# Patient Record
Sex: Female | Born: 1970
Health system: Southern US, Community
[De-identification: ages and names within clinical notes are randomized; demographics above are authoritative.]

## PROBLEM LIST (undated history)

## (undated) DIAGNOSIS — G43009 Migraine without aura, not intractable, without status migrainosus: Secondary | ICD-10-CM

## (undated) HISTORY — DX: Migraine without aura, not intractable, without status migrainosus: G43.009

---
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
## 2015-04-01 DIAGNOSIS — J342 Deviated nasal septum: Secondary | ICD-10-CM | POA: Insufficient documentation

## 2015-07-02 ENCOUNTER — Encounter: Payer: Self-pay | Admitting: Obstetrics and Gynecology

## 2015-07-02 ENCOUNTER — Ambulatory Visit (INDEPENDENT_AMBULATORY_CARE_PROVIDER_SITE_OTHER): Payer: Managed Care, Other (non HMO) | Admitting: Obstetrics and Gynecology

## 2015-07-02 VITALS — BP 118/70 | HR 76 | Resp 14 | Ht 60.5 in | Wt 119.0 lb

## 2015-07-02 DIAGNOSIS — Z01419 Encounter for gynecological examination (general) (routine) without abnormal findings: Secondary | ICD-10-CM

## 2015-07-02 DIAGNOSIS — Z0000 Encounter for general adult medical examination without abnormal findings: Secondary | ICD-10-CM

## 2015-07-02 DIAGNOSIS — Z124 Encounter for screening for malignant neoplasm of cervix: Secondary | ICD-10-CM

## 2015-07-02 LAB — COMPREHENSIVE METABOLIC PANEL
ALBUMIN: 4.2 g/dL (ref 3.6–5.1)
ALK PHOS: 50 U/L (ref 33–115)
ALT: 12 U/L (ref 6–29)
AST: 16 U/L (ref 10–30)
BILIRUBIN TOTAL: 0.6 mg/dL (ref 0.2–1.2)
BUN: 11 mg/dL (ref 7–25)
CALCIUM: 9.3 mg/dL (ref 8.6–10.2)
CO2: 23 mmol/L (ref 20–31)
CREATININE: 0.86 mg/dL (ref 0.50–1.10)
Chloride: 102 mmol/L (ref 98–110)
Glucose, Bld: 73 mg/dL (ref 65–99)
Potassium: 4.2 mmol/L (ref 3.5–5.3)
SODIUM: 138 mmol/L (ref 135–146)
TOTAL PROTEIN: 7 g/dL (ref 6.1–8.1)

## 2015-07-02 LAB — CBC
HEMATOCRIT: 42.7 % (ref 35.0–45.0)
HEMOGLOBIN: 14.1 g/dL (ref 11.7–15.5)
MCH: 28.3 pg (ref 27.0–33.0)
MCHC: 33 g/dL (ref 32.0–36.0)
MCV: 85.6 fL (ref 80.0–100.0)
MPV: 9.4 fL (ref 7.5–12.5)
Platelets: 307 10*3/uL (ref 140–400)
RBC: 4.99 MIL/uL (ref 3.80–5.10)
RDW: 13.3 % (ref 11.0–15.0)
WBC: 5.4 10*3/uL (ref 3.8–10.8)

## 2015-07-02 LAB — LIPID PANEL
CHOLESTEROL: 199 mg/dL (ref 125–200)
HDL: 88 mg/dL (ref >=46)
LDL CALC: 86 mg/dL (ref <130)
TRIGLYCERIDES: 124 mg/dL (ref <150)
Total CHOL/HDL Ratio: 2.3 Ratio (ref <=5.0)
VLDL: 25 mg/dL (ref <30)

## 2015-07-02 MED ORDER — ETONOGESTREL-ETHINYL ESTRADIOL 0.12-0.015 MG/24HR VA RING: VAGINAL_RING | VAGINAL | Status: DC

## 2015-07-02 NOTE — Progress Notes (Signed)
Patient ID: Laura Goodwin, female   DOB: 06/23/1970, 45 y.o.   MRN: [id number] 45 y.o. [id number] MarriedCaucasianF here for annual exam. She is needing refill of her Nuvaring. Sexually active, no pain. Period Duration (Days): 2-4 days  Period Pattern: Regular Menstrual Flow: Light Menstrual Control: Thin pad Dysmenorrhea: (!) Mild Dysmenorrhea Symptoms: Cramping  Patient's last menstrual period was 06/06/2015.          Sexually active: Yes.    The current method of family planning is NuvaRing vaginal inserts.    Exercising: Yes.    running/ walking/ weights Smoker:  no  Health Maintenance: Pap:  06/2014 WNL per patient History of abnormal Pap:  Yes- years ago, she had a leep. MMG:  07/2013 ([place] in [place])  Colonoscopy:  Never BMD:   07/2013 WNL per patient (done in [place])  TDaP:  2011 Gardasil: N/A   reports that she has never smoked. She has never used smokeless tobacco. She reports that she drinks about 1.2 oz of alcohol per week. 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 Kids are 8 and 5.   History reviewed. No pertinent past medical history.  Past Surgical History  Procedure Laterality Date  . Cesarean section    . Brain surgery  1980    removed abcess on brain   C/S x 2. After her 1st c/s she needed a second laparotomy 4 days later secondary to bleeding  Current Outpatient Prescriptions  Medication Sig Dispense Refill  . etonogestrel-ethinyl estradiol (NUVARING) 0.12-0.015 MG/24HR vaginal ring     . fluticasone (FLONASE) 50 MCG/ACT nasal spray Place 1 spray into both nostrils daily.     No current facility-administered medications for this visit.    Family History  Problem Relation Age of Onset  . Hypertension Father   . Stroke Maternal Grandmother   . Stroke Paternal Grandmother     Review of Systems  Constitutional: Negative.   HENT: Negative.   Eyes: Negative.   Respiratory: Negative.   Cardiovascular: Negative.    Gastrointestinal: Negative.   Endocrine: Negative.   Genitourinary: Negative.   Musculoskeletal: Negative.   Skin: Negative.   Allergic/Immunologic: Negative.   Neurological: Negative.   Psychiatric/Behavioral: Negative.     Exam:   BP 118/70 mmHg  Pulse 76  Resp 14  Ht 5' 0.5" (1.537 m)  Wt 119 lb (53.978 kg)  BMI 22.85 kg/m2  LMP 06/06/2015  Weight change: @WEIGHTCHANGE @ Height:   Height: 5' 0.5" (153.7 cm)  Ht Readings from Last 3 Encounters:  07/02/15 5' 0.5" (1.537 m)    General appearance: alert, cooperative and appears stated age Head: Normocephalic, without obvious abnormality, atraumatic Neck: no adenopathy, supple, symmetrical, trachea midline and thyroid normal to inspection and palpation Lungs: clear to auscultation bilaterally Breasts: normal appearance, no masses or tenderness Heart: regular rate and rhythm Abdomen: soft, non-tender; bowel sounds normal; no masses,  no organomegaly Extremities: extremities normal, atraumatic, no cyanosis or edema Skin: Skin color, texture, turgor normal. No rashes or lesions Lymph nodes: Cervical, supraclavicular, and axillary nodes normal. No abnormal inguinal nodes palpated Neurologic: Grossly normal   Pelvic: External genitalia:  no lesions              Urethra:  normal appearing urethra with no masses, tenderness or lesions              Bartholins and Skenes: normal  Vagina: normal appearing vagina with normal color and discharge, no lesions              Cervix: no lesions               Bimanual Exam:  Uterus:  normal size, contour, position, consistency, mobility, non-tender              Adnexa: no mass, fullness, tenderness               Rectovaginal: Confirms               Anus:  normal sphincter tone, no lesions  Chaperone was present for exam.  A:  Well Woman with normal exam  Contraception, continue nuvaring  P:   CBC, CMP, lipids, vit D  Pap with hpv  Continue nuvaring  Discussed breast  self exam  Discussed calcium and vit D intake

## 2015-07-02 NOTE — Patient Instructions (Signed)

## 2015-07-03 LAB — VITAMIN D 25 HYDROXY (VIT D DEFICIENCY, FRACTURES): VIT D 25 HYDROXY: 30 ng/mL (ref 30–100)

## 2015-07-08 LAB — IPS PAP TEST WITH HPV

## 2015-11-17 ENCOUNTER — Other Ambulatory Visit: Payer: Self-pay | Admitting: Obstetrics and Gynecology

## 2015-11-17 DIAGNOSIS — Z1231 Encounter for screening mammogram for malignant neoplasm of breast: Secondary | ICD-10-CM

## 2015-11-26 ENCOUNTER — Ambulatory Visit
Admission: RE | Admit: 2015-11-26 | Discharge: 2015-11-26 | Disposition: A | Discharge status: Home / Self Care | Payer: Managed Care, Other (non HMO) | Source: Ambulatory Visit | Attending: Obstetrics and Gynecology | Admitting: Obstetrics and Gynecology

## 2015-11-26 DIAGNOSIS — Z1231 Encounter for screening mammogram for malignant neoplasm of breast: Secondary | ICD-10-CM

## 2015-11-27 ENCOUNTER — Other Ambulatory Visit: Payer: Self-pay | Admitting: Obstetrics and Gynecology

## 2015-11-27 DIAGNOSIS — R928 Other abnormal and inconclusive findings on diagnostic imaging of breast: Secondary | ICD-10-CM

## 2015-12-01 ENCOUNTER — Ambulatory Visit
Admission: RE | Admit: 2015-12-01 | Discharge: 2015-12-01 | Disposition: A | Discharge status: Home / Self Care | Payer: Managed Care, Other (non HMO) | Source: Ambulatory Visit | Attending: Obstetrics and Gynecology | Admitting: Obstetrics and Gynecology

## 2015-12-01 DIAGNOSIS — R928 Other abnormal and inconclusive findings on diagnostic imaging of breast: Secondary | ICD-10-CM

## 2016-07-06 ENCOUNTER — Ambulatory Visit (INDEPENDENT_AMBULATORY_CARE_PROVIDER_SITE_OTHER): Payer: BLUE CROSS/BLUE SHIELD | Admitting: Obstetrics and Gynecology

## 2016-07-06 ENCOUNTER — Encounter: Payer: Self-pay | Admitting: Obstetrics and Gynecology

## 2016-07-06 VITALS — BP 118/70 | HR 76 | Resp 14 | Ht 60.0 in | Wt 121.0 lb

## 2016-07-06 DIAGNOSIS — Z3009 Encounter for other general counseling and advice on contraception: Secondary | ICD-10-CM | POA: Diagnosis not present

## 2016-07-06 DIAGNOSIS — Z0000 Encounter for general adult medical examination without abnormal findings: Secondary | ICD-10-CM

## 2016-07-06 DIAGNOSIS — R899 Unspecified abnormal finding in specimens from other organs, systems and tissues: Secondary | ICD-10-CM

## 2016-07-06 DIAGNOSIS — G43009 Migraine without aura, not intractable, without status migrainosus: Secondary | ICD-10-CM | POA: Insufficient documentation

## 2016-07-06 DIAGNOSIS — E559 Vitamin D deficiency, unspecified: Secondary | ICD-10-CM

## 2016-07-06 DIAGNOSIS — Z01419 Encounter for gynecological examination (general) (routine) without abnormal findings: Secondary | ICD-10-CM | POA: Diagnosis not present

## 2016-07-06 LAB — COMPREHENSIVE METABOLIC PANEL
ALK PHOS: 67 U/L (ref 33–115)
ALT: 37 U/L — AB (ref 6–29)
AST: 35 U/L — ABNORMAL HIGH (ref 10–30)
Albumin: 4.6 g/dL (ref 3.6–5.1)
BUN: 10 mg/dL (ref 7–25)
CALCIUM: 9.9 mg/dL (ref 8.6–10.2)
CHLORIDE: 102 mmol/L (ref 98–110)
CO2: 24 mmol/L (ref 20–31)
Creat: 0.9 mg/dL (ref 0.50–1.10)
GLUCOSE: 88 mg/dL (ref 65–99)
POTASSIUM: 4.3 mmol/L (ref 3.5–5.3)
Sodium: 138 mmol/L (ref 135–146)
TOTAL PROTEIN: 7.2 g/dL (ref 6.1–8.1)
Total Bilirubin: 0.9 mg/dL (ref 0.2–1.2)

## 2016-07-06 LAB — CBC
HEMATOCRIT: 43.5 % (ref 35.0–45.0)
Hemoglobin: 14.4 g/dL (ref 11.7–15.5)
MCH: 28.7 pg (ref 27.0–33.0)
MCHC: 33.1 g/dL (ref 32.0–36.0)
MCV: 86.8 fL (ref 80.0–100.0)
MPV: 9.2 fL (ref 7.5–12.5)
Platelets: 260 10*3/uL (ref 140–400)
RBC: 5.01 MIL/uL (ref 3.80–5.10)
RDW: 13.6 % (ref 11.0–15.0)
WBC: 5.9 10*3/uL (ref 3.8–10.8)

## 2016-07-06 MED ORDER — MISOPROSTOL 200 MCG PO TABS: ORAL_TABLET | ORAL | 0 refills | Status: DC

## 2016-07-06 NOTE — Progress Notes (Signed)
46 y.o. [id number] MarriedCaucasianF here for annual exam. Patient stopped using the Nuvaring last month due to in headaches. When she would take the nuvaring out she would get a headache a few days later. Very bad with mild sensitivity in her vision. Within a day or 2 of putting the nuvaring back in the headache would go away. She tried using the nuvaring continuously and had irregular bleeding. She stopped the nuvaringPatient wants to discuss new birth control options. After her second child. They tried to put a mirena in and was told there was too much scar tissue to get it in (c/s x 2 and laparotomy after one c/s she had a laparotomy for bleeding)  Period Cycle (Days): 28 Period Duration (Days): 2-3 days  Period Pattern: Regular Menstrual Flow: Light Menstrual Control: Thin pad Menstrual Control Change Freq (Hours): changes pad 2-3 times a day Dysmenorrhea: (!) Moderate Dysmenorrhea Symptoms: Headache, Cramping  Her cycle off of the nuvaring lasted x 5 days. Changed a pad 3 x a day. Cramps were no different. Headaches are better off of the nuvaring  Patient's last menstrual period was 06/15/2016.          Sexually active: Yes.    The current method of family planning is none.    Exercising: Yes.    running, weights, cardio  Smoker:  no  Health Maintenance: Pap:  07-02-15 WNL NEG HR HPV  History of abnormal Pap:  Yes- years ago MMG:  11-26-15  Abnormal U/S left breast WNL  Colonoscopy:  Never BMD:   Never TDaP:  07/2009 Gardasil: N/A   reports that she has never smoked. She has never used smokeless tobacco. She reports that she drinks about 1.2 oz of alcohol per week . She reports that she does not use drugs. 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   Past Medical History:  Diagnosis Date  . Migraine without aura     Past Surgical History:  Procedure Laterality Date  . BRAIN SURGERY  1980   removed abcess on brain   . CESAREAN SECTION      Current Outpatient Prescriptions   Medication Sig Dispense Refill  . fluticasone (FLONASE) 50 MCG/ACT nasal spray Place 1 spray into both nostrils daily.    Marland Kitchen etonogestrel-ethinyl estradiol (NUVARING) 0.12-0.015 MG/24HR vaginal ring Place vaginally, remove after 3 weeks. Place a new ring within a week (Patient not taking: Reported on 07/06/2016) 3 each 3   No current facility-administered medications for this visit.     Family History  Problem Relation Age of Onset  . Hypertension Father   . Stroke Maternal Grandmother   . Stroke Paternal Grandmother     Review of Systems  Constitutional: Negative.   HENT: Negative.   Eyes: Negative.   Respiratory: Negative.   Cardiovascular: Negative.   Gastrointestinal: Negative.   Endocrine: Negative.   Genitourinary: Negative.   Musculoskeletal: Negative.   Skin: Negative.   Allergic/Immunologic: Negative.   Neurological: Positive for headaches.  Psychiatric/Behavioral: Negative.     Exam:   BP 118/70 (BP Location: Right Arm, Patient Position: Sitting, Cuff Size: Normal)   Pulse 76   Resp 14   Ht 5' (1.524 m)   Wt 121 lb (54.9 kg)   LMP 06/15/2016   BMI 23.63 kg/m   Weight change: @WEIGHTCHANGE @ Height:   Height: 5' (152.4 cm)  Ht Readings from Last 3 Encounters:  07/06/16 5' (1.524 m)  07/02/15 5' 0.5" (1.537 m)    General appearance: alert, cooperative and  appears stated age Head: Normocephalic, without obvious abnormality, atraumatic Neck: no adenopathy, supple, symmetrical, trachea midline and thyroid normal to inspection and palpation Lungs: clear to auscultation bilaterally Cardiovascular: regular rate and rhythm Breasts: normal appearance, no masses or tenderness Abdomen: soft, non-tender; bowel sounds normal; no masses,  no organomegaly Extremities: extremities normal, atraumatic, no cyanosis or edema Skin: Skin color, texture, turgor normal. No rashes or lesions Lymph nodes: Cervical, supraclavicular, and axillary nodes normal. No abnormal inguinal  nodes palpated Neurologic: Grossly normal   Pelvic: External genitalia:  no lesions              Urethra:  normal appearing urethra with no masses, tenderness or lesions              Bartholins and Skenes: normal                 Vagina: normal appearing vagina with normal color and discharge, no lesions              Cervix: no lesions and appears stenotic, able to place a cytobrush               Bimanual Exam:  Uterus:  normal size, contour, position, consistency, mobility, non-tender              Adnexa: no mass, fullness, tenderness               Rectovaginal: Confirms               Anus:  normal sphincter tone, no lesions  Chaperone was present for exam.  A:  Well Woman with normal exam  Contraception, worsening migraines with the nuvaring  Screening labs  Vit d def  P:   No pap this year  Mammogram in the fall  CBC, CMP, vit D (normal lipids last year)  Discussed trying loseasoinique, she declines  Will try placing a mirena IUD under ultrasound guidance (couldn't be placed by prior provider)  Will pre-treat with cytotec

## 2016-07-06 NOTE — Patient Instructions (Signed)

## 2016-07-07 LAB — VITAMIN D 25 HYDROXY (VIT D DEFICIENCY, FRACTURES): VIT D 25 HYDROXY: 28 ng/mL — AB (ref 30–100)

## 2016-07-08 ENCOUNTER — Telehealth: Payer: Self-pay

## 2016-07-08 NOTE — Telephone Encounter (Signed)
Left message for patient to call Rowen Wilmer back.  

## 2016-07-08 NOTE — Telephone Encounter (Signed)
-----   Message from Romualdo BolkJill Evelyn Jertson, MD sent at 07/08/2016 10:31 AM EDT ----- Please let the patient know that her vit d is slightly low. She should start taking 800 IU a day of vit d 3 daily (long term).  Her liver function tests are minimally elevated. She was having lots of headaches, it is possible for ibuprofen or tylenol to cause this. She doesn't drink very much, but I would recommend that she not drink ETOH and try to avoid NSAID's and tylenol for the next month and then recheck a liver panel.  The rest of her lab work was normal.

## 2016-07-11 NOTE — Telephone Encounter (Signed)
Patient advised of lab results. Patient made appointment for 08/15/16 to have labs redrawn per Dr. Salli QuarryJertson's request. Patient verbalized understanding and agreement.

## 2016-07-11 NOTE — Telephone Encounter (Signed)
Patient returniing your call

## 2016-07-12 ENCOUNTER — Telehealth: Payer: Self-pay | Admitting: Obstetrics and Gynecology

## 2016-07-12 NOTE — Telephone Encounter (Signed)
Patient cancelled US guided IUD insertion for this Thursday.  Will call back to reschedule when she starts her period.

## 2016-07-14 ENCOUNTER — Other Ambulatory Visit: Payer: BLUE CROSS/BLUE SHIELD

## 2016-07-14 ENCOUNTER — Other Ambulatory Visit: Payer: BLUE CROSS/BLUE SHIELD | Admitting: Obstetrics and Gynecology

## 2016-08-03 NOTE — Telephone Encounter (Signed)
Placed call to patient to  follow up in regards to rescheduling an ultrasound guided Mirena IUD insertion. Patient advises she should start her cycle any day, but is leaving tomorrow, 08/04/16 for vacation and will be out of town through 08/15/16. Patients states she would like to defer scheduling, until her August cycle. Patient states she will call at the onset of her cycle in August, for scheduling.  Routing to Dr Oscar LaJertson for final review.

## 2016-08-15 ENCOUNTER — Other Ambulatory Visit (INDEPENDENT_AMBULATORY_CARE_PROVIDER_SITE_OTHER): Payer: BLUE CROSS/BLUE SHIELD

## 2016-08-15 DIAGNOSIS — R899 Unspecified abnormal finding in specimens from other organs, systems and tissues: Secondary | ICD-10-CM

## 2016-08-15 NOTE — Addendum Note (Signed)
Addended by: Francee PiccoloPHILLIPS, Terriyah Westra C on: 08/15/2016 11:44 AM   Modules accepted: Orders

## 2016-08-16 LAB — HEPATIC FUNCTION PANEL
ALT: 30 IU/L (ref 0–32)
AST: 38 IU/L (ref 0–40)
Albumin: 4.6 g/dL (ref 3.5–5.5)
Alkaline Phosphatase: 71 IU/L (ref 39–117)
Bilirubin Total: 0.8 mg/dL (ref 0.0–1.2)
Bilirubin, Direct: 0.19 mg/dL (ref 0.00–0.40)
Total Protein: 6.8 g/dL (ref 6.0–8.5)

## 2016-11-02 ENCOUNTER — Other Ambulatory Visit: Payer: Self-pay | Admitting: Obstetrics and Gynecology

## 2016-11-02 DIAGNOSIS — Z1231 Encounter for screening mammogram for malignant neoplasm of breast: Secondary | ICD-10-CM

## 2016-11-29 ENCOUNTER — Encounter (INDEPENDENT_AMBULATORY_CARE_PROVIDER_SITE_OTHER): Payer: Self-pay

## 2016-11-29 ENCOUNTER — Ambulatory Visit
Admission: RE | Admit: 2016-11-29 | Discharge: 2016-11-29 | Disposition: A | Discharge status: Home / Self Care | Payer: Managed Care, Other (non HMO) | Source: Ambulatory Visit | Attending: Obstetrics and Gynecology | Admitting: Obstetrics and Gynecology

## 2016-11-29 DIAGNOSIS — Z1231 Encounter for screening mammogram for malignant neoplasm of breast: Secondary | ICD-10-CM

## 2017-03-10 DIAGNOSIS — J069 Acute upper respiratory infection, unspecified: Secondary | ICD-10-CM | POA: Diagnosis not present

## 2017-03-10 DIAGNOSIS — J019 Acute sinusitis, unspecified: Secondary | ICD-10-CM | POA: Diagnosis not present

## 2017-04-24 DIAGNOSIS — M25562 Pain in left knee: Secondary | ICD-10-CM | POA: Diagnosis not present

## 2017-07-10 NOTE — Progress Notes (Signed)
47 y.o. [id number] MarriedCaucasianF here for annual exam.   Period Cycle (Days): 30 Period Duration (Days): 3-6 days Period Pattern: Regular Menstrual Flow: Light(heavy first 2 days) Menstrual Control: Tampon Menstrual Control Change Freq (Hours): every 3 hours on heaviest days Dysmenorrhea: (!) Moderate Dysmenorrhea Symptoms: Cramping(nausea 2 days prior)  She c/o irritability for 5-6 days prior to her cycle coming. Gets angry and frustrated. She was having bad headaches on the nuvaring, headaches are better off of the nuvaring. She uses exercise as a stress release.   Patient's last menstrual period was 06/17/2017 (exact date).          Sexually active: Yes.    The current method of family planning is condoms everytime.    Exercising: Yes.    cardio and weights. Smoker:  no  Health Maintenance: Pap: 07-02-15 Neg:Neg HR HPV History of abnormal Pap:  Yes, years ago MMG:  11-29-16 Density C/Neg/BiRads1 Colonoscopy:  n/a BMD:   n/a TDaP:  07/2009 Gardasil: no, discussed, would like to start.    reports that she has never smoked. She has never used smokeless tobacco. She reports that she drinks about 1.2 oz of alcohol per week. 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  Past Medical History:  Diagnosis Date  . Migraine without aura     Past Surgical History:  Procedure Laterality Date  . BRAIN SURGERY  1980   removed abcess on brain   . CESAREAN SECTION      Current Outpatient Medications  Medication Sig Dispense Refill  . fluticasone (FLONASE) 50 MCG/ACT nasal spray Place 1 spray into both nostrils daily.     No current facility-administered medications for this visit.     Family History  Problem Relation Age of Onset  . Hypertension Father   . Stroke Maternal Grandmother   . Stroke Paternal Grandmother   . Breast cancer Maternal Aunt     Review of Systems  Constitutional: Negative.   HENT: Negative.   Eyes: Negative.    Respiratory: Negative.   Cardiovascular: Negative.   Gastrointestinal: Negative.   Endocrine: Negative.   Genitourinary: Negative.   Musculoskeletal: Negative.   Skin: Negative.   Allergic/Immunologic: Negative.   Neurological: Negative.   Hematological: Negative.   Psychiatric/Behavioral: Negative.     Exam:   BP 110/66 (BP Location: Right Arm, Patient Position: Sitting, Cuff Size: Normal)   Pulse 70   Resp 16   Ht 5' 1.25" (1.556 m)   Wt 121 lb (54.9 kg)   LMP 06/17/2017 (Exact Date)   BMI 22.68 kg/m   Weight change: @WEIGHTCHANGE @ Height:   Height: 5' 1.25" (155.6 cm)  Ht Readings from Last 3 Encounters:  07/12/17 5' 1.25" (1.556 m)  07/06/16 5' (1.524 m)  07/02/15 5' 0.5" (1.537 m)    General appearance: alert, cooperative and appears stated age Head: Normocephalic, without obvious abnormality, atraumatic Neck: no adenopathy, supple, symmetrical, trachea midline and thyroid normal to inspection and palpation Lungs: clear to auscultation bilaterally Cardiovascular: regular rate and rhythm Breasts: bilateral fibrocystic changes. In the left breast at 11 o'clock is a 3 x 4 cm, cystic, mobile lump, not tender. No other distinct lumps. No skin changes. Abdomen: soft, non-tender; non distended,  no masses,  no organomegaly Extremities: extremities normal, atraumatic, no cyanosis or edema Skin: Skin color, texture, turgor normal. No rashes or lesions Lymph nodes: Cervical, supraclavicular, and axillary nodes normal. No abnormal inguinal nodes palpated Neurologic: Grossly normal  Pelvic: External genitalia:  no lesions              Urethra:  normal appearing urethra with no masses, tenderness or lesions              Bartholins and Skenes: normal                 Vagina: normal appearing vagina with normal color and discharge, no lesions              Cervix: no lesions               Bimanual Exam:  Uterus:  normal size, contour, position, consistency, mobility,  non-tender              Adnexa: no mass, fullness, tenderness               Rectovaginal: Confirms               Anus:  normal sphincter tone, no lesions  Chaperone was present for exam.  A:  Well Woman with normal exam  PMS  Vit d def  LFT elevated slightly last year  Left breast lump, 11 o'clock  P:   No Pap  Left breast imaging now, bilateral mammogram in the fall  Start Celexa 10 mg, will f/u next month  Discussed breast self exam  Discussed calcium and vit D intake  Screening labs, vit D  IFOB given  Start gardasil series

## 2017-07-12 ENCOUNTER — Encounter: Payer: Self-pay | Admitting: Obstetrics and Gynecology

## 2017-07-12 ENCOUNTER — Other Ambulatory Visit: Payer: Self-pay

## 2017-07-12 ENCOUNTER — Ambulatory Visit (INDEPENDENT_AMBULATORY_CARE_PROVIDER_SITE_OTHER): Payer: BLUE CROSS/BLUE SHIELD | Admitting: Obstetrics and Gynecology

## 2017-07-12 VITALS — BP 110/66 | HR 70 | Resp 16 | Ht 61.25 in | Wt 121.0 lb

## 2017-07-12 DIAGNOSIS — N943 Premenstrual tension syndrome: Secondary | ICD-10-CM | POA: Diagnosis not present

## 2017-07-12 DIAGNOSIS — Z23 Encounter for immunization: Secondary | ICD-10-CM

## 2017-07-12 DIAGNOSIS — E559 Vitamin D deficiency, unspecified: Secondary | ICD-10-CM | POA: Diagnosis not present

## 2017-07-12 DIAGNOSIS — Z7189 Other specified counseling: Secondary | ICD-10-CM | POA: Diagnosis not present

## 2017-07-12 DIAGNOSIS — Z0000 Encounter for general adult medical examination without abnormal findings: Secondary | ICD-10-CM | POA: Diagnosis not present

## 2017-07-12 DIAGNOSIS — Z01419 Encounter for gynecological examination (general) (routine) without abnormal findings: Secondary | ICD-10-CM | POA: Diagnosis not present

## 2017-07-12 DIAGNOSIS — Z7185 Encounter for immunization safety counseling: Secondary | ICD-10-CM

## 2017-07-12 DIAGNOSIS — Z1211 Encounter for screening for malignant neoplasm of colon: Secondary | ICD-10-CM

## 2017-07-12 DIAGNOSIS — N6322 Unspecified lump in the left breast, upper inner quadrant: Secondary | ICD-10-CM

## 2017-07-12 MED ORDER — CITALOPRAM HYDROBROMIDE 10 MG PO TABS: 10.0000 mg | ORAL_TABLET | Freq: Every day | ORAL | 1 refills | Status: DC

## 2017-07-12 NOTE — Patient Instructions (Addendum)
EXERCISE AND DIET:  We recommended that you start or continue a regular exercise program for good health. Regular exercise means any activity that makes your heart beat faster and makes you sweat.  We recommend exercising at least 30 minutes per day at least 3 days a week, preferably 4 or 5.  We also recommend a diet low in fat and sugar.  Inactivity, poor dietary choices and obesity can cause diabetes, heart attack, stroke, and kidney damage, among others.    ALCOHOL AND SMOKING:  Women should limit their alcohol intake to no more than 7 drinks/beers/glasses of wine (combined, not each!) per week. Moderation of alcohol intake to this level decreases your risk of breast cancer and liver damage. And of course, no recreational drugs are part of a healthy lifestyle.  And absolutely no smoking or even second hand smoke. Most people know smoking can cause heart and lung diseases, but did you know it also contributes to weakening of your bones? Aging of your skin?  Yellowing of your teeth and nails?  CALCIUM AND VITAMIN D:  Adequate intake of calcium and Vitamin D are recommended.  The recommendations for exact amounts of these supplements seem to change often, but generally speaking 600 mg of calcium (either carbonate or citrate) and 800 units of Vitamin D per day seems prudent. Certain women may benefit from higher intake of Vitamin D.  If you are among these women, your doctor will have told you during your visit.    PAP SMEARS:  Pap smears, to check for cervical cancer or precancers,  have traditionally been done yearly, although recent scientific advances have shown that most women can have pap smears less often.  However, every woman still should have a physical exam from her gynecologist every year. It will include a breast check, inspection of the vulva and vagina to check for abnormal growths or skin changes, a visual exam of the cervix, and then an exam to evaluate the size and shape of the uterus and  ovaries.  And after 45 years of age, a rectal exam is indicated to check for rectal cancers. We will also provide age appropriate advice regarding health maintenance, like when you should have certain vaccines, screening for sexually transmitted diseases, bone density testing, colonoscopy, mammograms, etc.   MAMMOGRAMS:  All women over 40 years old should have a yearly mammogram. Many facilities now offer a "3D" mammogram, which may cost around $50 extra out of pocket. If possible,  we recommend you accept the option to have the 3D mammogram performed.  It both reduces the number of women who will be called back for extra views which then turn out to be normal, and it is better than the routine mammogram at detecting truly abnormal areas.    COLONOSCOPY:  Colonoscopy to screen for colon cancer is recommended for all women at age 50.  We know, you hate the idea of the prep.  We agree, BUT, having colon cancer and not knowing it is worse!!  Colon cancer so often starts as a polyp that can be seen and removed at colonscopy, which can quite literally save your life!  And if your first colonoscopy is normal and you have no family history of colon cancer, most women don't have to have it again for 10 years.  Once every ten years, you can do something that may end up saving your life, right?  We will be happy to help you get it scheduled when you are ready.    Be sure to check your insurance coverage so you understand how much it will cost.  It may be covered as a preventative service at no cost, but you should check your particular policy.      Breast Self-Awareness Breast self-awareness means being familiar with how your breasts look and feel. It involves checking your breasts regularly and reporting any changes to your health care provider. Practicing breast self-awareness is important. A change in your breasts can be a sign of a serious medical problem. Being familiar with how your breasts look and feel allows  you to find any problems early, when treatment is more likely to be successful. All women should practice breast self-awareness, including women who have had breast implants. How to do a breast self-exam One way to learn what is normal for your breasts and whether your breasts are changing is to do a breast self-exam. To do a breast self-exam: Look for Changes  1. Remove all the clothing above your waist. 2. Stand in front of a mirror in a room with good lighting. 3. Put your hands on your hips. 4. Push your hands firmly downward. 5. Compare your breasts in the mirror. Look for differences between them (asymmetry), such as: ? Differences in shape. ? Differences in size. ? Puckers, dips, and bumps in one breast and not the other. 6. Look at each breast for changes in your skin, such as: ? Redness. ? Scaly areas. 7. Look for changes in your nipples, such as: ? Discharge. ? Bleeding. ? Dimpling. ? Redness. ? A change in position. Feel for Changes  Carefully feel your breasts for lumps and changes. It is best to do this while lying on your back on the floor and again while sitting or standing in the shower or tub with soapy water on your skin. Feel each breast in the following way:  Place the arm on the side of the breast you are examining above your head.  Feel your breast with the other hand.  Start in the nipple area and make  inch (2 cm) overlapping circles to feel your breast. Use the pads of your three middle fingers to do this. Apply light pressure, then medium pressure, then firm pressure. The light pressure will allow you to feel the tissue closest to the skin. The medium pressure will allow you to feel the tissue that is a little deeper. The firm pressure will allow you to feel the tissue close to the ribs.  Continue the overlapping circles, moving downward over the breast until you feel your ribs below your breast.  Move one finger-width toward the center of the body.  Continue to use the  inch (2 cm) overlapping circles to feel your breast as you move slowly up toward your collarbone.  Continue the up and down exam using all three pressures until you reach your armpit.  Write Down What You Find  Write down what is normal for each breast and any changes that you find. Keep a written record with breast changes or normal findings for each breast. By writing this information down, you do not need to depend only on memory for size, tenderness, or location. Write down where you are in your menstrual cycle, if you are still menstruating. If you are having trouble noticing differences in your breasts, do not get discouraged. With time you will become more familiar with the variations in your breasts and more comfortable with the exam. How often should I examine my breasts? Examine   your breasts every month. If you are breastfeeding, the best time to examine your breasts is after a feeding or after using a breast pump. If you menstruate, the best time to examine your breasts is 5-7 days after your period is over. During your period, your breasts are lumpier, and it may be more difficult to notice changes. When should I see my health care provider? See your health care provider if you notice:  A change in shape or size of your breasts or nipples.  A change in the skin of your breast or nipples, such as a reddened or scaly area.  Unusual discharge from your nipples.  A lump or thick area that was not there before.  Pain in your breasts.  Anything that concerns you.  This information is not intended to replace advice given to you by your health care provider. Make sure you discuss any questions you have with your health care provider. Document Released: 01/24/2005 Document Revised: 07/02/2015 Document Reviewed: 12/14/2014 Elsevier Interactive Patient Education  2018 [employer]  

## 2017-07-13 ENCOUNTER — Ambulatory Visit
Admission: RE | Admit: 2017-07-13 | Discharge: 2017-07-13 | Disposition: A | Discharge status: Home / Self Care | Payer: Managed Care, Other (non HMO) | Source: Ambulatory Visit | Attending: Obstetrics and Gynecology | Admitting: Obstetrics and Gynecology

## 2017-07-13 DIAGNOSIS — N6489 Other specified disorders of breast: Secondary | ICD-10-CM | POA: Diagnosis not present

## 2017-07-13 DIAGNOSIS — N6322 Unspecified lump in the left breast, upper inner quadrant: Secondary | ICD-10-CM

## 2017-07-13 DIAGNOSIS — R922 Inconclusive mammogram: Secondary | ICD-10-CM | POA: Diagnosis not present

## 2017-07-13 LAB — COMPREHENSIVE METABOLIC PANEL
ALK PHOS: 66 IU/L (ref 39–117)
ALT: 15 IU/L (ref 0–32)
AST: 16 IU/L (ref 0–40)
Albumin/Globulin Ratio: 1.6 (ref 1.2–2.2)
Albumin: 4.5 g/dL (ref 3.5–5.5)
BILIRUBIN TOTAL: 0.4 mg/dL (ref 0.0–1.2)
BUN / CREAT RATIO: 17 (ref 9–23)
BUN: 13 mg/dL (ref 6–24)
CHLORIDE: 102 mmol/L (ref 96–106)
CO2: 23 mmol/L (ref 20–29)
Calcium: 9.7 mg/dL (ref 8.7–10.2)
Creatinine, Ser: 0.76 mg/dL (ref 0.57–1.00)
GFR calc non Af Amer: 95 mL/min/{1.73_m2} (ref >59)
GFR, EST AFRICAN AMERICAN: 110 mL/min/{1.73_m2} (ref >59)
GLUCOSE: 87 mg/dL (ref 65–99)
Globulin, Total: 2.8 g/dL (ref 1.5–4.5)
Potassium: 4.5 mmol/L (ref 3.5–5.2)
Sodium: 139 mmol/L (ref 134–144)
TOTAL PROTEIN: 7.3 g/dL (ref 6.0–8.5)

## 2017-07-13 LAB — CBC
Hematocrit: 43.3 % (ref 34.0–46.6)
Hemoglobin: 14.2 g/dL (ref 11.1–15.9)
MCH: 29 pg (ref 26.6–33.0)
MCHC: 32.8 g/dL (ref 31.5–35.7)
MCV: 88 fL (ref 79–97)
PLATELETS: 296 10*3/uL (ref 150–450)
RBC: 4.9 x10E6/uL (ref 3.77–5.28)
RDW: 13.2 % (ref 12.3–15.4)
WBC: 6.6 10*3/uL (ref 3.4–10.8)

## 2017-07-13 LAB — LIPID PANEL
CHOLESTEROL TOTAL: 175 mg/dL (ref 100–199)
Chol/HDL Ratio: 2.2 ratio (ref 0.0–4.4)
HDL: 80 mg/dL (ref >39)
LDL Calculated: 83 mg/dL (ref 0–99)
Triglycerides: 60 mg/dL (ref 0–149)
VLDL Cholesterol Cal: 12 mg/dL (ref 5–40)

## 2017-07-13 LAB — VITAMIN D 25 HYDROXY (VIT D DEFICIENCY, FRACTURES): Vit D, 25-Hydroxy: 23.6 ng/mL — ABNORMAL LOW (ref 30.0–100.0)

## 2017-07-14 ENCOUNTER — Telehealth: Payer: Self-pay

## 2017-07-14 NOTE — Telephone Encounter (Signed)
Called patient to discuss results, left message for her to return my call. 

## 2017-07-14 NOTE — Telephone Encounter (Signed)
-----   Message from Romualdo BolkJill Evelyn Jertson, MD sent at 07/14/2017  1:45 PM EDT ----- Please inform the patient that her vit d is low and has dropped since last year. I would recommend that she increase her current vit D intake by 1,000 IU a day (long term). I would recheck her vit d next year. The rest of her blood work was normal.

## 2017-07-14 NOTE — Telephone Encounter (Signed)
Returned patient's call and lmovm to call me back. Advised this was nothing urgent or serious and could call Consuella Loselaine back on Monday.

## 2017-07-14 NOTE — Telephone Encounter (Signed)
Patient returning Amanda's call.

## 2017-07-18 NOTE — Telephone Encounter (Signed)
Patient returned call to Amanda. °

## 2017-07-20 NOTE — Telephone Encounter (Signed)
Spoke with patient and gave results and recommendations. Patient voiced understanding -eh 

## 2017-07-21 ENCOUNTER — Telehealth: Payer: Self-pay

## 2017-07-21 NOTE — Telephone Encounter (Signed)
Removed from mammogram hold. Patient is returning in July for a recheck and would like to have breast recheck at that time as well. Encounter closed.

## 2017-07-21 NOTE — Telephone Encounter (Signed)
-----   Message from Romualdo BolkJill Evelyn Jertson, MD sent at 07/14/2017  3:25 PM EDT ----- Please set the patient up for a 3 month breast check and take out of mammogram hold

## 2017-08-17 ENCOUNTER — Ambulatory Visit: Payer: BLUE CROSS/BLUE SHIELD | Admitting: Obstetrics and Gynecology

## 2017-08-29 ENCOUNTER — Encounter: Payer: Self-pay | Admitting: Obstetrics and Gynecology

## 2017-08-29 ENCOUNTER — Ambulatory Visit (INDEPENDENT_AMBULATORY_CARE_PROVIDER_SITE_OTHER): Payer: BLUE CROSS/BLUE SHIELD | Admitting: Obstetrics and Gynecology

## 2017-08-29 ENCOUNTER — Other Ambulatory Visit: Payer: Self-pay

## 2017-08-29 VITALS — BP 118/78 | HR 80 | Wt 121.4 lb

## 2017-08-29 DIAGNOSIS — E559 Vitamin D deficiency, unspecified: Secondary | ICD-10-CM

## 2017-08-29 DIAGNOSIS — E538 Deficiency of other specified B group vitamins: Secondary | ICD-10-CM

## 2017-08-29 DIAGNOSIS — N943 Premenstrual tension syndrome: Secondary | ICD-10-CM | POA: Diagnosis not present

## 2017-08-29 DIAGNOSIS — N6012 Diffuse cystic mastopathy of left breast: Secondary | ICD-10-CM

## 2017-08-29 NOTE — Progress Notes (Signed)
GYNECOLOGY  VISIT   HPI: 47 y.o.   Married  Caucasian  female   G2P2002 with Patient's last menstrual period was 08/15/2017 (exact date).   here for 1 month follow up on Celexa. She was started on Celexa for PMS. Patient stopped taking Celexa, because it made her sleepy in the afternoon. She only took it for a few days, on 10 mg dose.  Patient did not start Vitamin D as directed, misunderstood and started vit B12.  Needs breast recheck of L breast mass 11 o'clock. Negative imaging, area in question is a ridge of tissue. She feels it hasn't changes.   GYNECOLOGIC HISTORY: Patient's last menstrual period was 08/15/2017 (exact date). Contraception: Condoms Menopausal hormone therapy: None        OB History    Gravida  2   Para  2   Term  2   Preterm      AB      Living  2     SAB      TAB      Ectopic      Multiple      Live Births  2              Patient Active Problem List   Diagnosis Date Noted  . Migraine without aura     Past Medical History:  Diagnosis Date  . Migraine without aura     Past Surgical History:  Procedure Laterality Date  . BRAIN SURGERY  1980   removed abcess on brain   . CESAREAN SECTION      Current Outpatient Medications  Medication Sig Dispense Refill  . Cyanocobalamin (VITAMIN B 12 PO) Take 1 tablet by mouth.    . fluticasone (FLONASE) 50 MCG/ACT nasal spray Place 1 spray into both nostrils daily.     No current facility-administered medications for this visit.      ALLERGIES: Penicillins  Family History  Problem Relation Age of Onset  . Hypertension Father   . Stroke Maternal Grandmother   . Stroke Paternal Grandmother   . Breast cancer Maternal Aunt     Social History   Socioeconomic History  . Marital status: Married    Spouse name: Not on file  . Number of children: Not on file  . Years of education: Not on file  . Highest education level: Not on file  Occupational History  . Not on file  Social Needs   . Financial resource strain: Not on file  . Food insecurity:    Worry: Not on file    Inability: Not on file  . Transportation needs:    Medical: Not on file    Non-medical: Not on file  Tobacco Use  . Smoking status: Never Smoker  . Smokeless tobacco: Never Used  Substance and Sexual Activity  . Alcohol use: Yes    Alcohol/week: 1.2 oz    Types: 2 Standard drinks or equivalent per week  . Drug use: No  . Sexual activity: Yes    Partners: Male    Birth control/protection: Condom  Lifestyle  . Physical activity:    Days per week: Not on file    Minutes per session: Not on file  . Stress: Not on file  Relationships  . Social connections:    Talks on phone: Not on file    Gets together: Not on file    Attends religious service: Not on file    Active member of club or organization: Not on  file    Attends meetings of clubs or organizations: Not on file    Relationship status: Not on file  . Intimate partner violence:    Fear of current or ex partner: Not on file    Emotionally abused: Not on file    Physically abused: Not on file    Forced sexual activity: Not on file  Other Topics Concern  . Not on file  Social History Narrative  . Not on file    Review of Systems  Constitutional: Negative.   HENT: Negative.   Eyes: Negative.   Respiratory: Negative.   Cardiovascular: Negative.   Gastrointestinal: Negative.   Genitourinary: Negative.   Musculoskeletal: Negative.   Skin: Negative.   Neurological: Negative.   Endo/Heme/Allergies: Negative.   Psychiatric/Behavioral: Negative.     PHYSICAL EXAMINATION:    BP 118/78 (BP Location: Right Arm, Patient Position: Sitting)   Pulse 80   Wt 121 lb 6.4 oz (55.1 kg)   LMP 08/15/2017 (Exact Date)   BMI 22.75 kg/m     General appearance: alert, cooperative and appears stated age Breasts: in the left breast at 11-12 o'clock is a 3 x 2 cm, smooth mobile lump (smaller). Bilateral fibrocystic changes laterally in the upper  outer quadrants.  No supraclavicular or axillary adenopathy  ASSESSMENT Stable left breast lump Vit D def, patient misunderstood and started vit B12 PMS, only took the Celexa for a few days, made her tired    PLAN Continue breast self exams Check vit B12 level Recommend she start vit D supplements We discussed that she can change the time of day she takes the Celexa and this will likely help with the sleepiness. Side effects usually improve with time.  She will restart the Celexa and f/u in the next 1-2 months  An After Visit Summary was printed and given to the patient.

## 2017-08-30 LAB — VITAMIN B12: Vitamin B-12: 314 pg/mL (ref 232–1245)

## 2017-09-18 NOTE — Progress Notes (Signed)
Patient in today for 2nd Gardasil injection.   Contraception: condoms LMP: 09/15/17 Last AEX: 07/12/2017 with Dr Oscar LaJertson.  Injection given in right deltoid  Patient tolerated shot well.   Patient informed next injection due in about 4 months.  Advised patient, if not on birth control, to return for next injection with cycle.   Routed to provider for final review.  Encounter closed.

## 2017-09-19 ENCOUNTER — Ambulatory Visit (INDEPENDENT_AMBULATORY_CARE_PROVIDER_SITE_OTHER): Payer: BLUE CROSS/BLUE SHIELD

## 2017-09-19 VITALS — BP 118/62 | HR 60 | Ht 61.0 in | Wt 119.0 lb

## 2017-09-19 DIAGNOSIS — Z23 Encounter for immunization: Secondary | ICD-10-CM | POA: Diagnosis not present

## 2017-10-31 ENCOUNTER — Telehealth: Payer: Self-pay | Admitting: Obstetrics and Gynecology

## 2017-10-31 NOTE — Telephone Encounter (Signed)
Patient cancelled 2 month recheck. She is doing fine.

## 2017-11-02 ENCOUNTER — Ambulatory Visit: Payer: BLUE CROSS/BLUE SHIELD | Admitting: Obstetrics and Gynecology

## 2017-11-24 DIAGNOSIS — Z23 Encounter for immunization: Secondary | ICD-10-CM | POA: Diagnosis not present

## 2018-07-26 ENCOUNTER — Ambulatory Visit: Payer: BLUE CROSS/BLUE SHIELD | Admitting: Obstetrics and Gynecology

## 2018-08-28 ENCOUNTER — Other Ambulatory Visit: Payer: Self-pay

## 2018-08-28 NOTE — Progress Notes (Signed)
48 y.o. G32P2002 Married White or Caucasian Unavailable female here for annual exam. No dyspareunia.    Period Cycle (Days): 28 Period Duration (Days): 4 days Period Pattern: Regular Menstrual Flow: Moderate, Light Menstrual Control: Tampon Menstrual Control Change Freq (Hours): changes tampon every 3-4 hours Dysmenorrhea: (!) Moderate Dysmenorrhea Symptoms: Cramping  Patient's last menstrual period was 08/05/2018 (exact date).          Sexually active: Yes.    The current method of family planning is condoms most of the time.    Exercising: Yes.    running, walking, lifting weights Smoker:  no  Health Maintenance: Pap: 07-02-15 Neg:Neg HR HPV History of abnormal Pap:  Yes, years ago MMG:  07/13/2017 diag left with u/s Birads 1 negative, was due for screening 11/2017 Colonoscopy:  n/a BMD:   n/a TDaP:  07/2009, she thinks it is due.  Gardasil: Completed 2   reports that she has never smoked. She has never used smokeless tobacco. She reports current alcohol use of about 2.0 standard drinks of alcohol per week. She reports that she does not use drugs. 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 Kids are 11 and 8.  Past Medical History:  Diagnosis Date  . Migraine without aura     Past Surgical History:  Procedure Laterality Date  . [place]   removed abcess on brain   . CESAREAN SECTION      Current Outpatient Medications  Medication Sig Dispense Refill  . Cyanocobalamin (VITAMIN B 12 PO) Take 1 tablet by mouth.    . fluticasone (FLONASE) 50 MCG/ACT nasal spray Place 1 spray into both nostrils daily.     No current facility-administered medications for this visit.     Family History  Problem Relation Age of Onset  . Hypertension Father   . Stroke Maternal Grandmother   . Stroke Paternal Grandmother   . Breast cancer Maternal Aunt     Review of Systems  Constitutional: Negative.   HENT: Negative.   Eyes: Negative.   Respiratory:  Negative.   Cardiovascular: Negative.   Gastrointestinal: Negative.   Endocrine: Negative.   Genitourinary: Negative.   Musculoskeletal: Negative.   Skin: Negative.   Allergic/Immunologic: Negative.   Neurological: Negative.   Hematological: Negative.   Psychiatric/Behavioral: Negative.     Exam:   BP 122/82 (BP Location: Right Arm, Patient Position: Sitting, Cuff Size: Normal)   Pulse 76   Temp 98.2 F (36.8 C) (Skin)   Ht 5' 0.63" (1.54 m)   Wt 120 lb (54.4 kg)   LMP 08/05/2018 (Exact Date)   BMI 22.95 kg/m   Weight change: @WEIGHTCHANGE @ Height:   Height: 5' 0.63" (154 cm)  Ht Readings from Last 3 Encounters:  08/30/18 5' 0.63" (1.54 m)  09/19/17 5\' 1"  (1.549 m)  07/12/17 5' 1.25" (1.556 m)    General appearance: alert, cooperative and appears stated age Head: Normocephalic, without obvious abnormality, atraumatic Neck: no adenopathy, supple, symmetrical, trachea midline and thyroid normal to inspection and palpation Lungs: clear to auscultation bilaterally Cardiovascular: regular rate and rhythm Breasts: bilateral fibrocystic chantes, In the left breast is a stable 3-4 cm, mobile, NT lump. No skin changes, no other lumps.  Abdomen: soft, non-tender; non distended,  no masses,  no organomegaly Extremities: extremities normal, atraumatic, no cyanosis or edema Skin: Skin color, texture, turgor normal. No rashes or lesions Lymph nodes: Cervical, supraclavicular, and axillary nodes normal. No abnormal inguinal nodes palpated Neurologic: Grossly  normal   Pelvic: External genitalia:  no lesions              Urethra:  normal appearing urethra with no masses, tenderness or lesions              Bartholins and Skenes: normal                 Vagina: normal appearing vagina with normal color and discharge, no lesions              Cervix: no lesions               Bimanual Exam:  Uterus:  normal size, contour, position, consistency, mobility, non-tender and anteverted               Adnexa: no mass, fullness, tenderness               Rectovaginal: Confirms               Anus:  normal sphincter tone, no lesions  Chaperone was present for exam.  A:  Well Woman with normal exam  Stable breast lump, previous normal imagina  P:   Pap with hpv  Screening labs, vit d, B12  TDAP today  Mammogram overdue, recommend 3D  Discussed breast self exam  Discussed calcium and vit D intake  Cologuard

## 2018-08-30 ENCOUNTER — Encounter: Payer: Self-pay | Admitting: Obstetrics and Gynecology

## 2018-08-30 ENCOUNTER — Other Ambulatory Visit: Payer: Self-pay

## 2018-08-30 ENCOUNTER — Ambulatory Visit (INDEPENDENT_AMBULATORY_CARE_PROVIDER_SITE_OTHER): Payer: BC Managed Care – PPO | Admitting: Obstetrics and Gynecology

## 2018-08-30 ENCOUNTER — Other Ambulatory Visit (HOSPITAL_COMMUNITY)
Admission: RE | Admit: 2018-08-30 | Discharge: 2018-08-30 | Disposition: A | Discharge status: Home / Self Care | Payer: BC Managed Care – PPO | Source: Ambulatory Visit | Attending: Obstetrics and Gynecology | Admitting: Obstetrics and Gynecology

## 2018-08-30 VITALS — BP 122/82 | HR 76 | Temp 98.2°F | Ht 60.63 in | Wt 120.0 lb

## 2018-08-30 DIAGNOSIS — Z124 Encounter for screening for malignant neoplasm of cervix: Secondary | ICD-10-CM

## 2018-08-30 DIAGNOSIS — Z23 Encounter for immunization: Secondary | ICD-10-CM

## 2018-08-30 DIAGNOSIS — Z0000 Encounter for general adult medical examination without abnormal findings: Secondary | ICD-10-CM | POA: Diagnosis not present

## 2018-08-30 DIAGNOSIS — E538 Deficiency of other specified B group vitamins: Secondary | ICD-10-CM

## 2018-08-30 DIAGNOSIS — E559 Vitamin D deficiency, unspecified: Secondary | ICD-10-CM | POA: Diagnosis not present

## 2018-08-30 DIAGNOSIS — Z01419 Encounter for gynecological examination (general) (routine) without abnormal findings: Secondary | ICD-10-CM | POA: Diagnosis not present

## 2018-08-30 DIAGNOSIS — N6322 Unspecified lump in the left breast, upper inner quadrant: Secondary | ICD-10-CM

## 2018-08-30 NOTE — Patient Instructions (Signed)
Check on coverage for Cologuard  Set up a 3D mammogram  EXERCISE AND DIET:  We recommended that you start or continue a regular exercise program for good health. Regular exercise means any activity that makes your heart beat faster and makes you sweat.  We recommend exercising at least 30 minutes per day at least 3 days a week, preferably 4 or 5.  We also recommend a diet low in fat and sugar.  Inactivity, poor dietary choices and obesity can cause diabetes, heart attack, stroke, and kidney damage, among others.    ALCOHOL AND SMOKING:  Women should limit their alcohol intake to no more than 7 drinks/beers/glasses of wine (combined, not each!) per week. Moderation of alcohol intake to this level decreases your risk of breast cancer and liver damage. And of course, no recreational drugs are part of a healthy lifestyle.  And absolutely no smoking or even second hand smoke. Most people know smoking can cause heart and lung diseases, but did you know it also contributes to weakening of your bones? Aging of your skin?  Yellowing of your teeth and nails?  CALCIUM AND VITAMIN D:  Adequate intake of calcium and Vitamin D are recommended.  The recommendations for exact amounts of these supplements seem to change often, but generally speaking 1,000 mg of calcium (between diet and supplement) and 800 units of Vitamin D per day seems prudent. Certain women may benefit from higher intake of Vitamin D.  If you are among these women, your doctor will have told you during your visit.    PAP SMEARS:  Pap smears, to check for cervical cancer or precancers,  have traditionally been done yearly, although recent scientific advances have shown that most women can have pap smears less often.  However, every woman still should have a physical exam from her gynecologist every year. It will include a breast check, inspection of the vulva and vagina to check for abnormal growths or skin changes, a visual exam of the cervix, and then  an exam to evaluate the size and shape of the uterus and ovaries.  And after 46 years of age, a rectal exam is indicated to check for rectal cancers. We will also provide age appropriate advice regarding health maintenance, like when you should have certain vaccines, screening for sexually transmitted diseases, bone density testing, colonoscopy, mammograms, etc.   MAMMOGRAMS:  All women over 49 years old should have a yearly mammogram. Many facilities now offer a "3D" mammogram, which may cost around $50 extra out of pocket. If possible,  we recommend you accept the option to have the 3D mammogram performed.  It both reduces the number of women who will be called back for extra views which then turn out to be normal, and it is better than the routine mammogram at detecting truly abnormal areas.    COLON CANCER SCREENING: Now recommend starting at age 48. At this time colonoscopy is not covered for routine screening until 50. There are take home tests that can be done between 45-49.   COLONOSCOPY:  Colonoscopy to screen for colon cancer is recommended for all women at age 31.  We know, you hate the idea of the prep.  We agree, BUT, having colon cancer and not knowing it is worse!!  Colon cancer so often starts as a polyp that can be seen and removed at colonscopy, which can quite literally save your life!  And if your first colonoscopy is normal and you have no family history  of colon cancer, most women don't have to have it again for 10 years.  Once every ten years, you can do something that may end up saving your life, right?  We will be happy to help you get it scheduled when you are ready.  Be sure to check your insurance coverage so you understand how much it will cost.  It may be covered as a preventative service at no cost, but you should check your particular policy.      Breast Self-Awareness Breast self-awareness means being familiar with how your breasts look and feel. It involves checking your  breasts regularly and reporting any changes to your health care provider. Practicing breast self-awareness is important. A change in your breasts can be a sign of a serious medical problem. Being familiar with how your breasts look and feel allows you to find any problems early, when treatment is more likely to be successful. All women should practice breast self-awareness, including women who have had breast implants. How to do a breast self-exam One way to learn what is normal for your breasts and whether your breasts are changing is to do a breast self-exam. To do a breast self-exam: Look for Changes  1. Remove all the clothing above your waist. 2. Stand in front of a mirror in a room with good lighting. 3. Put your hands on your hips. 4. Push your hands firmly downward. 5. Compare your breasts in the mirror. Look for differences between them (asymmetry), such as: ? Differences in shape. ? Differences in size. ? Puckers, dips, and bumps in one breast and not the other. 6. Look at each breast for changes in your skin, such as: ? Redness. ? Scaly areas. 7. Look for changes in your nipples, such as: ? Discharge. ? Bleeding. ? Dimpling. ? Redness. ? A change in position. Feel for Changes Carefully feel your breasts for lumps and changes. It is best to do this while lying on your back on the floor and again while sitting or standing in the shower or tub with soapy water on your skin. Feel each breast in the following way:  Place the arm on the side of the breast you are examining above your head.  Feel your breast with the other hand.  Start in the nipple area and make  inch (2 cm) overlapping circles to feel your breast. Use the pads of your three middle fingers to do this. Apply light pressure, then medium pressure, then firm pressure. The light pressure will allow you to feel the tissue closest to the skin. The medium pressure will allow you to feel the tissue that is a little deeper.  The firm pressure will allow you to feel the tissue close to the ribs.  Continue the overlapping circles, moving downward over the breast until you feel your ribs below your breast.  Move one finger-width toward the center of the body. Continue to use the  inch (2 cm) overlapping circles to feel your breast as you move slowly up toward your collarbone.  Continue the up and down exam using all three pressures until you reach your armpit.  Write Down What You Find  Write down what is normal for each breast and any changes that you find. Keep a written record with breast changes or normal findings for each breast. By writing this information down, you do not need to depend only on memory for size, tenderness, or location. Write down where you are in your menstrual cycle,  if you are still menstruating. If you are having trouble noticing differences in your breasts, do not get discouraged. With time you will become more familiar with the variations in your breasts and more comfortable with the exam. How often should I examine my breasts? Examine your breasts every month. If you are breastfeeding, the best time to examine your breasts is after a feeding or after using a breast pump. If you menstruate, the best time to examine your breasts is 5-7 days after your period is over. During your period, your breasts are lumpier, and it may be more difficult to notice changes. When should I see my health care provider? See your health care provider if you notice:  A change in shape or size of your breasts or nipples.  A change in the skin of your breast or nipples, such as a reddened or scaly area.  Unusual discharge from your nipples.  A lump or thick area that was not there before.  Pain in your breasts.  Anything that concerns you.

## 2018-08-31 LAB — CYTOLOGY - PAP
Diagnosis: NEGATIVE
HPV: NOT DETECTED

## 2018-08-31 LAB — CBC
Hematocrit: 44 % (ref 34.0–46.6)
Hemoglobin: 14.3 g/dL (ref 11.1–15.9)
MCH: 29.2 pg (ref 26.6–33.0)
MCHC: 32.5 g/dL (ref 31.5–35.7)
MCV: 90 fL (ref 79–97)
Platelets: 289 10*3/uL (ref 150–450)
RBC: 4.9 x10E6/uL (ref 3.77–5.28)
RDW: 12.5 % (ref 11.7–15.4)
WBC: 6.5 10*3/uL (ref 3.4–10.8)

## 2018-08-31 LAB — COMPREHENSIVE METABOLIC PANEL
ALT: 15 IU/L (ref 0–32)
AST: 17 IU/L (ref 0–40)
Albumin/Globulin Ratio: 2.1 (ref 1.2–2.2)
Albumin: 4.6 g/dL (ref 3.8–4.8)
Alkaline Phosphatase: 63 IU/L (ref 39–117)
BUN/Creatinine Ratio: 12 (ref 9–23)
BUN: 10 mg/dL (ref 6–24)
Bilirubin Total: 0.5 mg/dL (ref 0.0–1.2)
CO2: 23 mmol/L (ref 20–29)
Calcium: 9.7 mg/dL (ref 8.7–10.2)
Chloride: 102 mmol/L (ref 96–106)
Creatinine, Ser: 0.83 mg/dL (ref 0.57–1.00)
GFR calc Af Amer: 98 mL/min/{1.73_m2} (ref >59)
GFR calc non Af Amer: 85 mL/min/{1.73_m2} (ref >59)
Globulin, Total: 2.2 g/dL (ref 1.5–4.5)
Glucose: 94 mg/dL (ref 65–99)
Potassium: 4.1 mmol/L (ref 3.5–5.2)
Sodium: 139 mmol/L (ref 134–144)
Total Protein: 6.8 g/dL (ref 6.0–8.5)

## 2018-08-31 LAB — LIPID PANEL
Chol/HDL Ratio: 1.9 ratio (ref 0.0–4.4)
Cholesterol, Total: 182 mg/dL (ref 100–199)
HDL: 96 mg/dL (ref >39)
LDL Calculated: 75 mg/dL (ref 0–99)
Triglycerides: 55 mg/dL (ref 0–149)
VLDL Cholesterol Cal: 11 mg/dL (ref 5–40)

## 2018-08-31 LAB — VITAMIN D 25 HYDROXY (VIT D DEFICIENCY, FRACTURES): Vit D, 25-Hydroxy: 31.5 ng/mL (ref 30.0–100.0)

## 2018-08-31 LAB — VITAMIN B12: Vitamin B-12: 243 pg/mL (ref 232–1245)

## 2018-10-10 DIAGNOSIS — Z23 Encounter for immunization: Secondary | ICD-10-CM | POA: Diagnosis not present

## 2018-12-17 ENCOUNTER — Other Ambulatory Visit: Payer: Self-pay | Admitting: Obstetrics and Gynecology

## 2018-12-17 DIAGNOSIS — Z1231 Encounter for screening mammogram for malignant neoplasm of breast: Secondary | ICD-10-CM

## 2019-01-04 DIAGNOSIS — Z20828 Contact with and (suspected) exposure to other viral communicable diseases: Secondary | ICD-10-CM | POA: Diagnosis not present

## 2019-02-07 ENCOUNTER — Other Ambulatory Visit: Payer: Self-pay

## 2019-02-07 ENCOUNTER — Ambulatory Visit
Admission: RE | Admit: 2019-02-07 | Discharge: 2019-02-07 | Disposition: A | Discharge status: Home / Self Care | Payer: BC Managed Care – PPO | Source: Ambulatory Visit | Attending: Obstetrics and Gynecology | Admitting: Obstetrics and Gynecology

## 2019-02-07 DIAGNOSIS — Z1231 Encounter for screening mammogram for malignant neoplasm of breast: Secondary | ICD-10-CM | POA: Diagnosis not present

## 2019-02-11 ENCOUNTER — Other Ambulatory Visit: Payer: Self-pay | Admitting: Obstetrics and Gynecology

## 2019-02-11 DIAGNOSIS — R928 Other abnormal and inconclusive findings on diagnostic imaging of breast: Secondary | ICD-10-CM

## 2019-02-15 ENCOUNTER — Other Ambulatory Visit: Payer: Managed Care, Other (non HMO)

## 2019-02-27 ENCOUNTER — Ambulatory Visit
Admission: RE | Admit: 2019-02-27 | Discharge: 2019-02-27 | Disposition: A | Discharge status: Home / Self Care | Payer: Managed Care, Other (non HMO) | Source: Ambulatory Visit | Attending: Obstetrics and Gynecology | Admitting: Obstetrics and Gynecology

## 2019-02-27 ENCOUNTER — Other Ambulatory Visit: Payer: Self-pay

## 2019-02-27 DIAGNOSIS — R922 Inconclusive mammogram: Secondary | ICD-10-CM | POA: Diagnosis not present

## 2019-02-27 DIAGNOSIS — R928 Other abnormal and inconclusive findings on diagnostic imaging of breast: Secondary | ICD-10-CM

## 2019-02-27 DIAGNOSIS — N6489 Other specified disorders of breast: Secondary | ICD-10-CM | POA: Diagnosis not present

## 2019-09-02 NOTE — Progress Notes (Deleted)
49 y.o. G4P2002 Married White or Caucasian Unavailable female here for annual exam.      No LMP recorded.          Sexually active: {yes no:314532}  The current method of family planning is {contraception:315051}.    Exercising: {yes KD:326712}  {types:19826} Smoker:  {YES NO:22349}  Health Maintenance: Pap: 7/23/20WNL HPV Neg, 07-02-15 Neg:Neg HR HPV History of abnormal Pap:  no MMG:  02/27/19 Bi-rads 1 neg  BMD:   None  Colonoscopy: none  TDaP:  08/30/18  Gardasil: Completed 2    reports that she has never smoked. She has never used smokeless tobacco. She reports current alcohol use of about 2.0 standard drinks of alcohol per week. She reports that she does not use drugs.  Past Medical History:  Diagnosis Date  . Migraine without aura     Past Surgical History:  Procedure Laterality Date  . BRAIN SURGERY  1980   removed abcess on brain   . CESAREAN SECTION      Current Outpatient Medications  Medication Sig Dispense Refill  . Cyanocobalamin (VITAMIN B 12 PO) Take 1 tablet by mouth.    . fluticasone (FLONASE) 50 MCG/ACT nasal spray Place 1 spray into both nostrils daily.     No current facility-administered medications for this visit.    Family History  Problem Relation Age of Onset  . Hypertension Father   . Stroke Maternal Grandmother   . Stroke Paternal Grandmother   . Breast cancer Maternal Aunt     Review of Systems  Exam:   There were no vitals taken for this visit.  Weight change: @WEIGHTCHANGE @ Height:      Ht Readings from Last 3 Encounters:  08/30/18 5' 0.63" (1.54 m)  09/19/17 5\' 1"  (1.549 m)  07/12/17 5' 1.25" (1.556 m)    General appearance: alert, cooperative and appears stated age Head: Normocephalic, without obvious abnormality, atraumatic Neck: no adenopathy, supple, symmetrical, trachea midline and thyroid {CHL AMB PHY EX THYROID NORM DEFAULT:[phone]::"normal to inspection and palpation"} Lungs: clear to auscultation  bilaterally Cardiovascular: regular rate and rhythm Breasts: {Exam; breast:13139::"normal appearance, no masses or tenderness"} Abdomen: soft, non-tender; non distended,  no masses,  no organomegaly Extremities: extremities normal, atraumatic, no cyanosis or edema Skin: Skin color, texture, turgor normal. No rashes or lesions Lymph nodes: Cervical, supraclavicular, and axillary nodes normal. No abnormal inguinal nodes palpated Neurologic: Grossly normal   Pelvic: External genitalia:  no lesions              Urethra:  normal appearing urethra with no masses, tenderness or lesions              Bartholins and Skenes: normal                 Vagina: normal appearing vagina with normal color and discharge, no lesions              Cervix: {CHL AMB PHY EX CERVIX NORM DEFAULT:[phone]::"no lesions"}               Bimanual Exam:  Uterus:  {CHL AMB PHY EX UTERUS NORM DEFAULT:[phone]::"normal size, contour, position, consistency, mobility, non-tender"}              Adnexa: {CHL AMB PHY EX ADNEXA NO MASS [phone]::"no mass, fullness, tenderness"}               Rectovaginal: Confirms               Anus:  normal  sphincter tone, no lesions  *** chaperoned for the exam.  A:  Well Woman with normal exam  P:

## 2019-09-05 ENCOUNTER — Ambulatory Visit: Payer: BC Managed Care – PPO | Admitting: Obstetrics and Gynecology

## 2019-09-09 ENCOUNTER — Encounter: Payer: Self-pay | Admitting: Obstetrics and Gynecology

## 2019-09-09 ENCOUNTER — Other Ambulatory Visit: Payer: Self-pay

## 2019-09-09 ENCOUNTER — Ambulatory Visit (INDEPENDENT_AMBULATORY_CARE_PROVIDER_SITE_OTHER): Payer: BC Managed Care – PPO | Admitting: Obstetrics and Gynecology

## 2019-09-09 VITALS — BP 116/64 | HR 68 | Ht 60.5 in | Wt 122.0 lb

## 2019-09-09 DIAGNOSIS — E538 Deficiency of other specified B group vitamins: Secondary | ICD-10-CM | POA: Diagnosis not present

## 2019-09-09 DIAGNOSIS — N926 Irregular menstruation, unspecified: Secondary | ICD-10-CM | POA: Diagnosis not present

## 2019-09-09 DIAGNOSIS — Z0000 Encounter for general adult medical examination without abnormal findings: Secondary | ICD-10-CM

## 2019-09-09 DIAGNOSIS — Z01419 Encounter for gynecological examination (general) (routine) without abnormal findings: Secondary | ICD-10-CM

## 2019-09-09 DIAGNOSIS — E559 Vitamin D deficiency, unspecified: Secondary | ICD-10-CM | POA: Diagnosis not present

## 2019-09-09 NOTE — Patient Instructions (Signed)
EXERCISE AND DIET:  We recommended that you start or continue a regular exercise program for good health. Regular exercise means any activity that makes your heart beat faster and makes you sweat.  We recommend exercising at least 30 minutes per day at least 3 days a week, preferably 4 or 5.  We also recommend a diet low in fat and sugar.  Inactivity, poor dietary choices and obesity can cause diabetes, heart attack, stroke, and kidney damage, among others.    ALCOHOL AND SMOKING:  Women should limit their alcohol intake to no more than 7 drinks/beers/glasses of wine (combined, not each!) per week. Moderation of alcohol intake to this level decreases your risk of breast cancer and liver damage. And of course, no recreational drugs are part of a healthy lifestyle.  And absolutely no smoking or even second hand smoke. Most people know smoking can cause heart and lung diseases, but did you know it also contributes to weakening of your bones? Aging of your skin?  Yellowing of your teeth and nails?  CALCIUM AND VITAMIN D:  Adequate intake of calcium and Vitamin D are recommended.  The recommendations for exact amounts of these supplements seem to change often, but generally speaking 1,000 mg of calcium (between diet and supplement) and 800 units of Vitamin D per day seems prudent. Certain women may benefit from higher intake of Vitamin D.  If you are among these women, your doctor will have told you during your visit.    PAP SMEARS:  Pap smears, to check for cervical cancer or precancers,  have traditionally been done yearly, although recent scientific advances have shown that most women can have pap smears less often.  However, every woman still should have a physical exam from her gynecologist every year. It will include a breast check, inspection of the vulva and vagina to check for abnormal growths or skin changes, a visual exam of the cervix, and then an exam to evaluate the size and shape of the uterus and  ovaries.  And after 47 years of age, a rectal exam is indicated to check for rectal cancers. We will also provide age appropriate advice regarding health maintenance, like when you should have certain vaccines, screening for sexually transmitted diseases, bone density testing, colonoscopy, mammograms, etc.   MAMMOGRAMS:  All women over 40 years old should have a yearly mammogram. Many facilities now offer a "3D" mammogram, which may cost around $50 extra out of pocket. If possible,  we recommend you accept the option to have the 3D mammogram performed.  It both reduces the number of women who will be called back for extra views which then turn out to be normal, and it is better than the routine mammogram at detecting truly abnormal areas.    COLON CANCER SCREENING: Now recommend starting at age 45. At this time colonoscopy is not covered for routine screening until 50. There are take home tests that can be done between 45-49.   COLONOSCOPY:  Colonoscopy to screen for colon cancer is recommended for all women at age 50.  We know, you hate the idea of the prep.  We agree, BUT, having colon cancer and not knowing it is worse!!  Colon cancer so often starts as a polyp that can be seen and removed at colonscopy, which can quite literally save your life!  And if your first colonoscopy is normal and you have no family history of colon cancer, most women don't have to have it again for   10 years.  Once every ten years, you can do something that may end up saving your life, right?  We will be happy to help you get it scheduled when you are ready.  Be sure to check your insurance coverage so you understand how much it will cost.  It may be covered as a preventative service at no cost, but you should check your particular policy.      Breast Self-Awareness Breast self-awareness means being familiar with how your breasts look and feel. It involves checking your breasts regularly and reporting any changes to your  health care provider. Practicing breast self-awareness is important. A change in your breasts can be a sign of a serious medical problem. Being familiar with how your breasts look and feel allows you to find any problems early, when treatment is more likely to be successful. All women should practice breast self-awareness, including women who have had breast implants. How to do a breast self-exam One way to learn what is normal for your breasts and whether your breasts are changing is to do a breast self-exam. To do a breast self-exam: Look for Changes  1. Remove all the clothing above your waist. 2. Stand in front of a mirror in a room with good lighting. 3. Put your hands on your hips. 4. Push your hands firmly downward. 5. Compare your breasts in the mirror. Look for differences between them (asymmetry), such as: ? Differences in shape. ? Differences in size. ? Puckers, dips, and bumps in one breast and not the other. 6. Look at each breast for changes in your skin, such as: ? Redness. ? Scaly areas. 7. Look for changes in your nipples, such as: ? Discharge. ? Bleeding. ? Dimpling. ? Redness. ? A change in position. Feel for Changes Carefully feel your breasts for lumps and changes. It is best to do this while lying on your back on the floor and again while sitting or standing in the shower or tub with soapy water on your skin. Feel each breast in the following way:  Place the arm on the side of the breast you are examining above your head.  Feel your breast with the other hand.  Start in the nipple area and make  inch (2 cm) overlapping circles to feel your breast. Use the pads of your three middle fingers to do this. Apply light pressure, then medium pressure, then firm pressure. The light pressure will allow you to feel the tissue closest to the skin. The medium pressure will allow you to feel the tissue that is a little deeper. The firm pressure will allow you to feel the tissue  close to the ribs.  Continue the overlapping circles, moving downward over the breast until you feel your ribs below your breast.  Move one finger-width toward the center of the body. Continue to use the  inch (2 cm) overlapping circles to feel your breast as you move slowly up toward your collarbone.  Continue the up and down exam using all three pressures until you reach your armpit.  Write Down What You Find  Write down what is normal for each breast and any changes that you find. Keep a written record with breast changes or normal findings for each breast. By writing this information down, you do not need to depend only on memory for size, tenderness, or location. Write down where you are in your menstrual cycle, if you are still menstruating. If you are having trouble noticing differences   in your breasts, do not get discouraged. With time you will become more familiar with the variations in your breasts and more comfortable with the exam. How often should I examine my breasts? Examine your breasts every month. If you are breastfeeding, the best time to examine your breasts is after a feeding or after using a breast pump. If you menstruate, the best time to examine your breasts is 5-7 days after your period is over. During your period, your breasts are lumpier, and it may be more difficult to notice changes. When should I see my health care provider? See your health care provider if you notice:  A change in shape or size of your breasts or nipples.  A change in the skin of your breast or nipples, such as a reddened or scaly area.  Unusual discharge from your nipples.  A lump or thick area that was not there before.  Pain in your breasts.  Anything that concerns you.  

## 2019-09-09 NOTE — Progress Notes (Signed)
49 y.o. G58P2002 Married White or Caucasian Unavailable female here for annual exam.   Cycles are 21-27 days x 3 days. Cramps are tolerable. Has headaches and mood changes for 2-3 days prior to her cycle. Sexually active, no pain. Not sleeping as well as she used to.  She had bad headaches with the nuvaring. Prior GYN couldn't get a mirena in.  Period Cycle (Days): 28 Period Duration (Days): 3 Period Pattern: Regular Menstrual Flow: Heavy Menstrual Control: Tampon Menstrual Control Change Freq (Hours): 3 Dysmenorrhea: (!) Moderate Dysmenorrhea Symptoms: Cramping, Headache  Patient's last menstrual period was 09/05/2019.          Sexually active: Yes.    The current method of family planning is condoms. All the time.    Exercising: Yes.    Running, weights Smoker:  no  Health Maintenance: Pap: 08/30/18 WNL HPV Neg, 07-02-15 Neg:Neg HR HPV History of abnormal Pap:  Yes, years ago MMG:  02/27/19 density C Bi-rads 1 neg  BMD: n/a Colonoscopy: n/a TDaP:  08/30/18 Gardasil: x2   reports that she has never smoked. She has never used smokeless tobacco. She reports current alcohol use of about 2.0 standard drinks of alcohol per week. She reports that she does not use drugs. Works as a Investment banker, corporate.  Kids are 12 and 9.  Past Medical History:  Diagnosis Date  . Migraine without aura     Past Surgical History:  Procedure Laterality Date  . BRAIN SURGERY  1980   removed abcess on brain   . CESAREAN SECTION      Current Outpatient Medications  Medication Sig Dispense Refill  . Cyanocobalamin (VITAMIN B 12 PO) Take 1 tablet by mouth.    . fluticasone (FLONASE) 50 MCG/ACT nasal spray Place 1 spray into both nostrils daily.     No current facility-administered medications for this visit.    Family History  Problem Relation Age of Onset  . Hypertension Father   . Stroke Maternal Grandmother   . Stroke Paternal Grandmother   . Breast cancer Maternal Aunt     Review of Systems   All other systems reviewed and are negative.   Exam:   BP (!) 116/64   Pulse 68   Ht 5' 0.5" (1.537 m)   Wt 122 lb (55.3 kg)   LMP 09/05/2019   SpO2 100%   BMI 23.43 kg/m   Weight change: @WEIGHTCHANGE @ Height:   Height: 5' 0.5" (153.7 cm)  Ht Readings from Last 3 Encounters:  09/09/19 5' 0.5" (1.537 m)  08/30/18 5' 0.63" (1.54 m)  09/19/17 5\' 1"  (1.549 m)    General appearance: alert, cooperative and appears stated age Head: Normocephalic, without obvious abnormality, atraumatic Neck: no adenopathy, supple, symmetrical, trachea midline and thyroid normal to inspection and palpation Lungs: clear to auscultation bilaterally Cardiovascular: regular rate and rhythm Breasts:  Stable 3 x 4 cm, cystic mobile, not tender lump in the left breast at 11 o'clock. No other lumps, no skin changes.  Abdomen: soft, non-tender; non distended,  no masses,  no organomegaly Extremities: extremities normal, atraumatic, no cyanosis or edema Skin: Skin color, texture, turgor normal. No rashes or lesions Lymph nodes: Cervical, supraclavicular, and axillary nodes normal. No abnormal inguinal nodes palpated Neurologic: Grossly normal   Pelvic: External genitalia:  no lesions              Urethra:  normal appearing urethra with no masses, tenderness or lesions  Bartholins and Skenes: normal                 Vagina: normal appearing vagina with normal color and discharge, no lesions              Cervix: no lesions               Bimanual Exam:  Uterus:  normal size, contour, position, consistency, mobility, non-tender              Adnexa: no mass, fullness, tenderness               Rectovaginal: Confirms               Anus:  normal sphincter tone, no lesions  Carolynn Serve chaperoned for the exam.  A:  Well Woman with normal exam  H/o vit d and B12 def.   Some cycle changes  Contraception, doing okay with condoms. Previously had worsening headaches with the nuvaring.   Menstrual  changes, still within the realm of normal      P:   No pap this year  Mammogram UTD  Condoms for contraception.   Discussed the option of the mirena IUD (would use cytotec and ultrasound guided insertion). Information given  Screening labs, TSH, Vit D, Vit B12

## 2019-09-10 LAB — VITAMIN D 25 HYDROXY (VIT D DEFICIENCY, FRACTURES): Vit D, 25-Hydroxy: 24.3 ng/mL — ABNORMAL LOW (ref 30.0–100.0)

## 2019-09-10 LAB — COMPREHENSIVE METABOLIC PANEL
ALT: 16 IU/L (ref 0–32)
AST: 17 IU/L (ref 0–40)
Albumin/Globulin Ratio: 2.4 — ABNORMAL HIGH (ref 1.2–2.2)
Albumin: 4.8 g/dL (ref 3.8–4.8)
Alkaline Phosphatase: 70 IU/L (ref 48–121)
BUN/Creatinine Ratio: 18 (ref 9–23)
BUN: 14 mg/dL (ref 6–24)
Bilirubin Total: 0.5 mg/dL (ref 0.0–1.2)
CO2: 26 mmol/L (ref 20–29)
Calcium: 9.6 mg/dL (ref 8.7–10.2)
Chloride: 101 mmol/L (ref 96–106)
Creatinine, Ser: 0.76 mg/dL (ref 0.57–1.00)
GFR calc Af Amer: 108 mL/min/{1.73_m2} (ref >59)
GFR calc non Af Amer: 94 mL/min/{1.73_m2} (ref >59)
Globulin, Total: 2 g/dL (ref 1.5–4.5)
Glucose: 91 mg/dL (ref 65–99)
Potassium: 4.7 mmol/L (ref 3.5–5.2)
Sodium: 139 mmol/L (ref 134–144)
Total Protein: 6.8 g/dL (ref 6.0–8.5)

## 2019-09-10 LAB — CBC
Hematocrit: 41.1 % (ref 34.0–46.6)
Hemoglobin: 13.7 g/dL (ref 11.1–15.9)
MCH: 30 pg (ref 26.6–33.0)
MCHC: 33.3 g/dL (ref 31.5–35.7)
MCV: 90 fL (ref 79–97)
Platelets: 304 10*3/uL (ref 150–450)
RBC: 4.56 x10E6/uL (ref 3.77–5.28)
RDW: 12.9 % (ref 11.7–15.4)
WBC: 4.9 10*3/uL (ref 3.4–10.8)

## 2019-09-10 LAB — LIPID PANEL
Chol/HDL Ratio: 2.2 ratio (ref 0.0–4.4)
Cholesterol, Total: 197 mg/dL (ref 100–199)
HDL: 91 mg/dL (ref >39)
LDL Chol Calc (NIH): 92 mg/dL (ref 0–99)
Triglycerides: 81 mg/dL (ref 0–149)
VLDL Cholesterol Cal: 14 mg/dL (ref 5–40)

## 2019-09-10 LAB — TSH: TSH: 0.733 u[IU]/mL (ref 0.450–4.500)

## 2019-09-10 LAB — VITAMIN B12: Vitamin B-12: 151 pg/mL — ABNORMAL LOW (ref 232–1245)

## 2019-11-23 DIAGNOSIS — Z23 Encounter for immunization: Secondary | ICD-10-CM | POA: Diagnosis not present

## 2020-07-15 DIAGNOSIS — Z13220 Encounter for screening for lipoid disorders: Secondary | ICD-10-CM | POA: Diagnosis not present

## 2020-07-15 DIAGNOSIS — J0101 Acute recurrent maxillary sinusitis: Secondary | ICD-10-CM | POA: Diagnosis not present

## 2020-07-15 DIAGNOSIS — D508 Other iron deficiency anemias: Secondary | ICD-10-CM | POA: Diagnosis not present

## 2020-07-15 DIAGNOSIS — H6501 Acute serous otitis media, right ear: Secondary | ICD-10-CM | POA: Diagnosis not present

## 2020-07-15 DIAGNOSIS — Z0000 Encounter for general adult medical examination without abnormal findings: Secondary | ICD-10-CM | POA: Diagnosis not present

## 2020-07-15 DIAGNOSIS — Z87898 Personal history of other specified conditions: Secondary | ICD-10-CM | POA: Diagnosis not present

## 2020-07-15 DIAGNOSIS — Z136 Encounter for screening for cardiovascular disorders: Secondary | ICD-10-CM | POA: Diagnosis not present

## 2020-08-17 DIAGNOSIS — Z87898 Personal history of other specified conditions: Secondary | ICD-10-CM | POA: Diagnosis not present

## 2020-08-17 DIAGNOSIS — Z1231 Encounter for screening mammogram for malignant neoplasm of breast: Secondary | ICD-10-CM | POA: Diagnosis not present

## 2020-09-08 IMAGING — MG MM DIGITAL DIAGNOSTIC UNILAT*L* W/ TOMO W/ CAD
4 series · 4 of 12 positions shown · non-contrast
Comparison: Previous exam(s).
COMPARISON: Previous exam(s).

Addendum:
CLINICAL DATA: 48-year-old female presenting as a recall from
screening for possible left breast asymmetry

EXAM:
DIGITAL DIAGNOSTIC LEFT MAMMOGRAM WITH TOMO
ULTRASOUND LEFT BREAST

[L CC synth-2D]
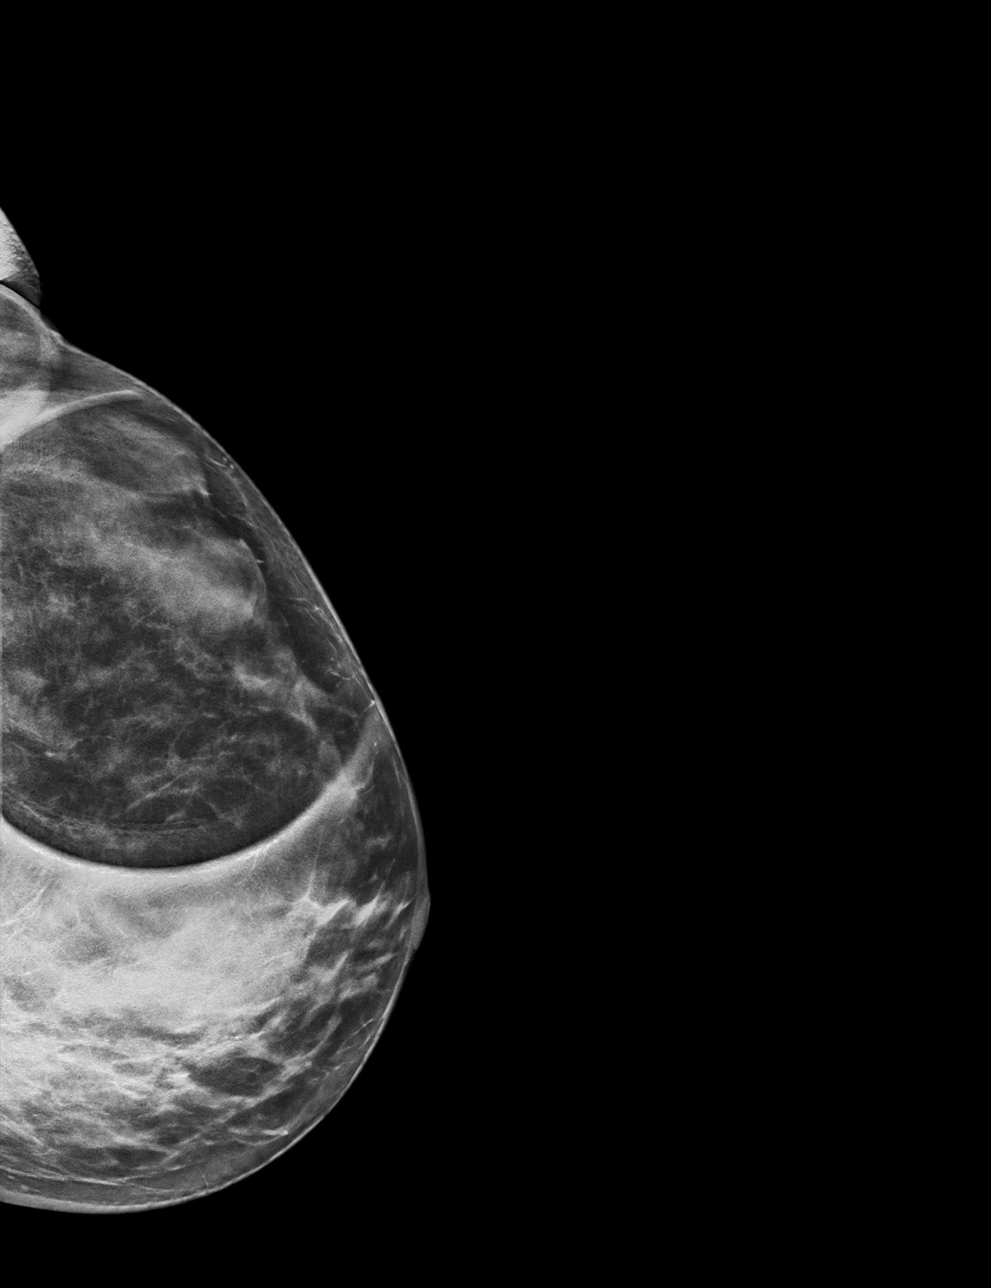

[L ML synth-2D]
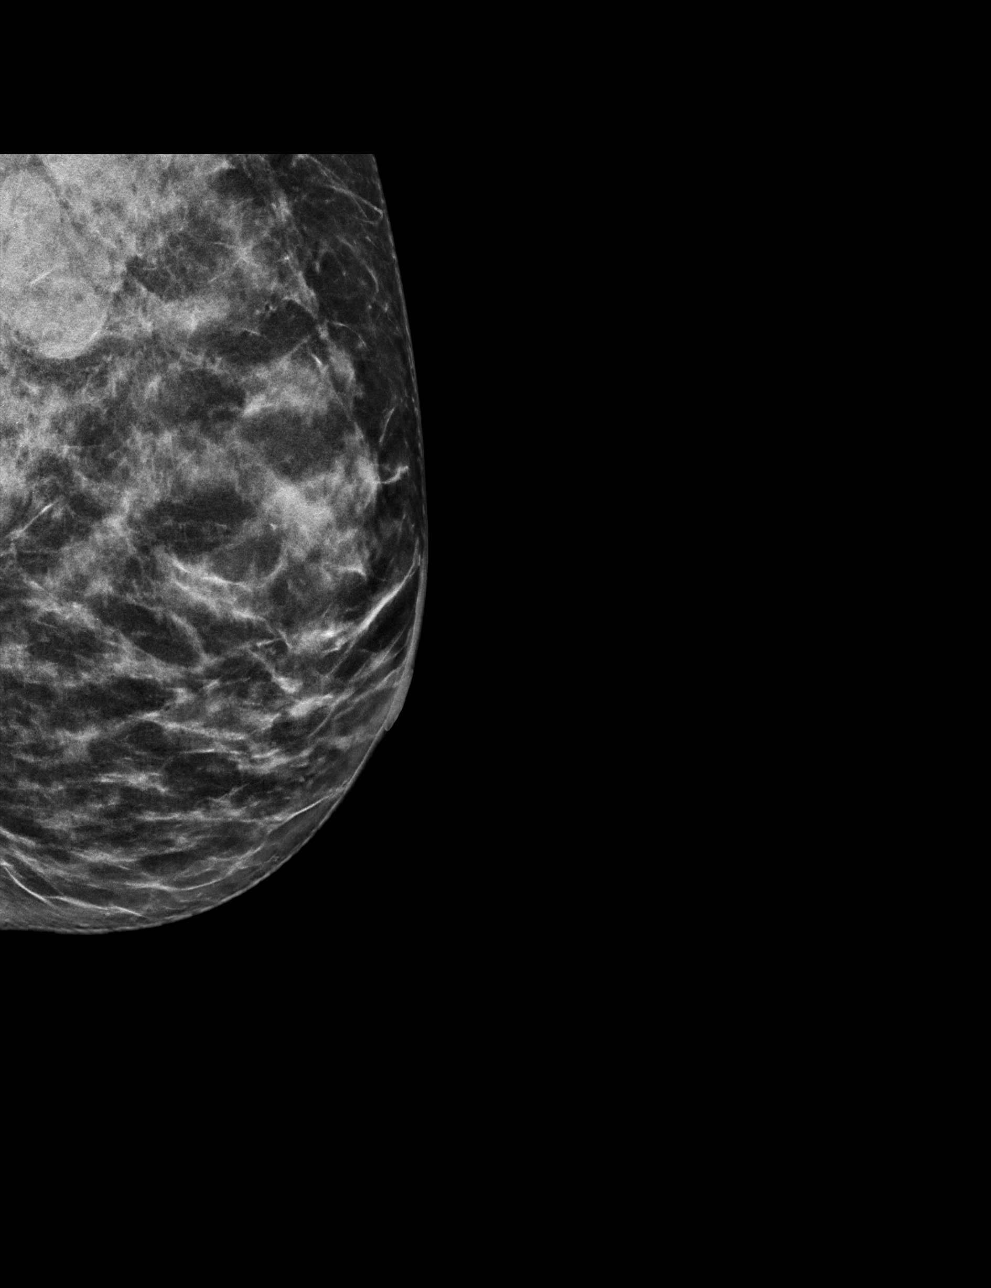

[L ML tomo · tomo slice 26/51.0]
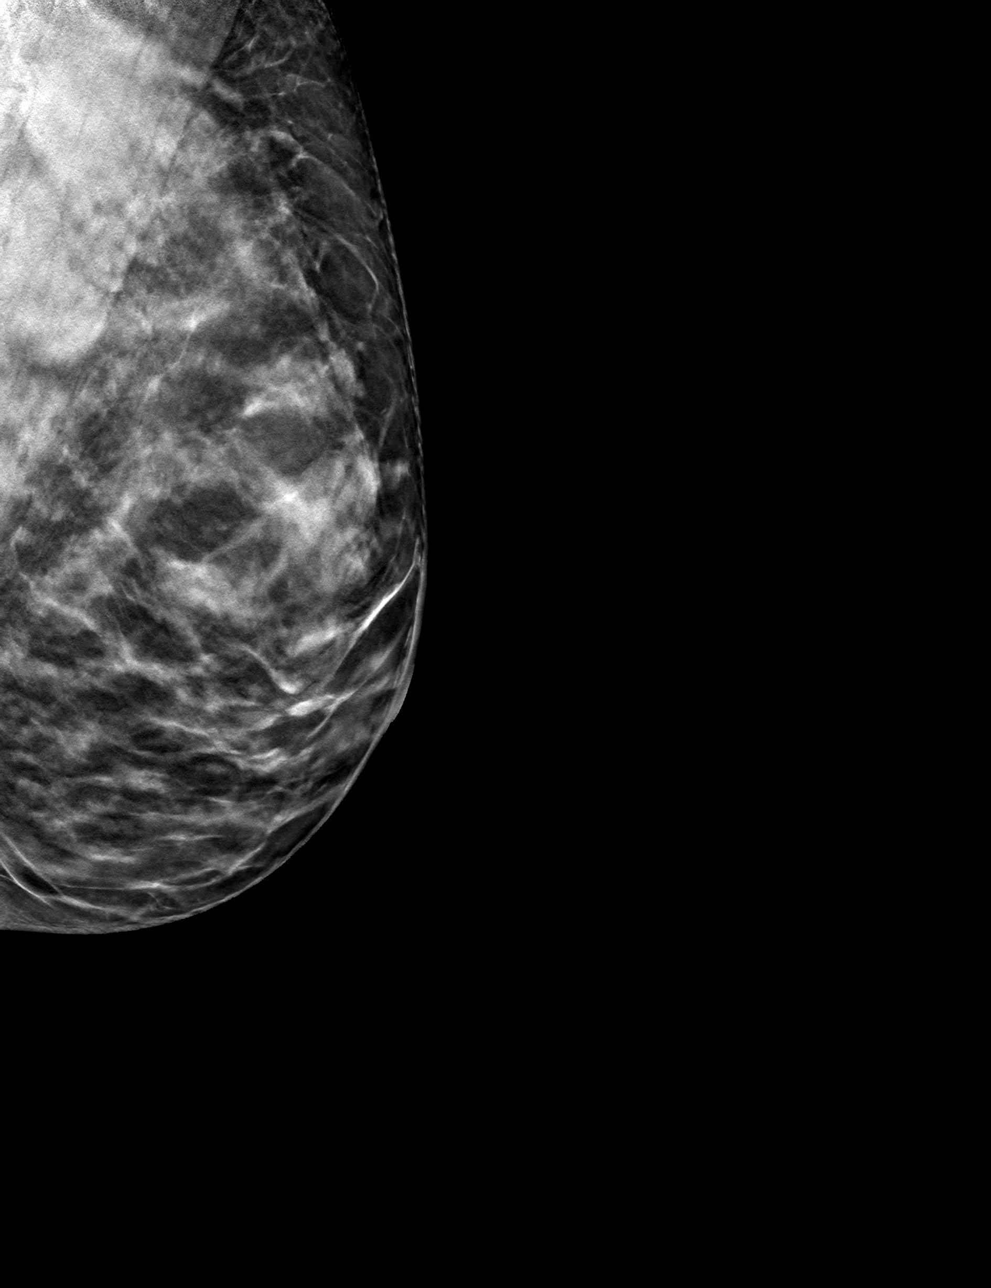

[L CC tomo · tomo slice 27/54.0]
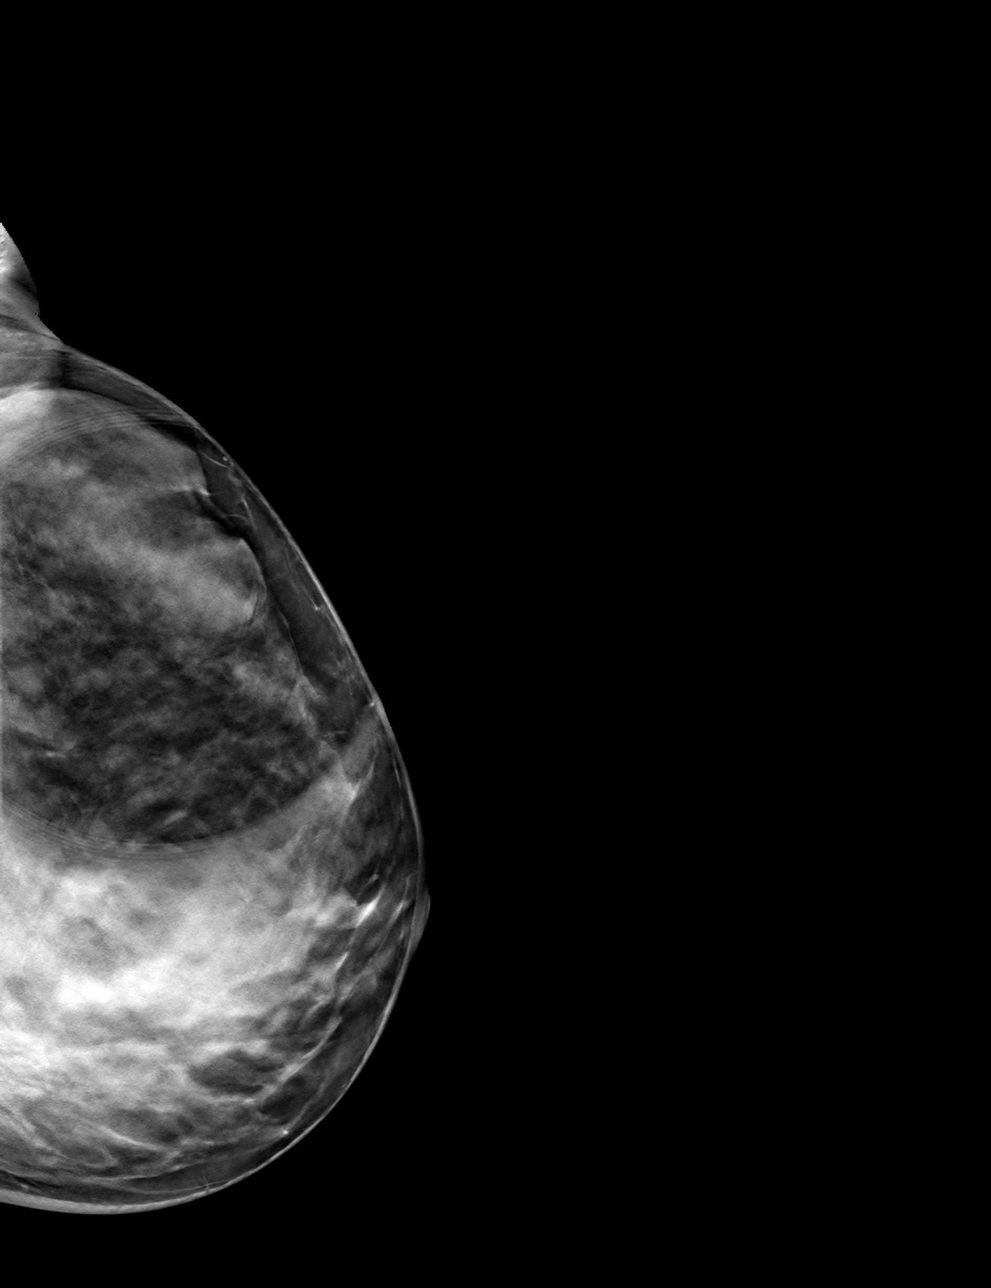

[4 of 12 positions shown; findings below may reference images not displayed]

ACR Breast Density Category c: The breast tissue is heterogeneously
dense, which may obscure small masses.
FINDINGS: Mammogram: Spot compression views were performed for the questioned
asymmetry in the outer left breast. On the additional imaging there
is dense tissue without a definite underlying mass or distortion.

On physical exam, I do not palpate a fixed discrete mass in the
outer right breast.

Ultrasound:

Targeted ultrasound is performed throughout the outer right breast
demonstrating no suspicious cystic or solid mass. There is no
finding to correspond to the questioned asymmetry on mammogram.
IMPRESSION: No mammographic or sonographic evidence of malignancy in the left
breast.

RECOMMENDATION:
Screening mammogram in one year.(Code:71-S-SX5)

I have discussed the findings and recommendations with the patient.
If applicable, a reminder letter will be sent to the patient
regarding the next appointment.

BI-RADS CATEGORY  1: Negative.
FINDINGS: Mammogram: Spot compression views were performed for the questioned
asymmetry in the outer left breast. On the additional imaging there
is dense tissue without a definite underlying mass or distortion.

On physical exam, I do not palpate a fixed discrete mass in the
outer left breast.

Ultrasound:

Targeted ultrasound is performed throughout the outer left breast
demonstrating no suspicious cystic or solid mass. There is no
finding to correspond to the questioned asymmetry on mammogram.

*** End of Addendum ***
ACR Breast Density Category c: The breast tissue is heterogeneously
dense, which may obscure small masses.
FINDINGS: Mammogram: Spot compression views were performed for the questioned
asymmetry in the outer left breast. On the additional imaging there
is dense tissue without a definite underlying mass or distortion.

On physical exam, I do not palpate a fixed discrete mass in the
outer right breast.

Ultrasound:

Targeted ultrasound is performed throughout the outer right breast
demonstrating no suspicious cystic or solid mass. There is no
finding to correspond to the questioned asymmetry on mammogram.
IMPRESSION: No mammographic or sonographic evidence of malignancy in the left
breast.

RECOMMENDATION:
Screening mammogram in one year.(Code:71-S-SX5)

I have discussed the findings and recommendations with the patient.
If applicable, a reminder letter will be sent to the patient
regarding the next appointment.

BI-RADS CATEGORY  1: Negative.

## 2020-09-09 NOTE — Progress Notes (Deleted)
50 y.o. G42P2002 Married White or Caucasian Unavailable female here for annual exam.      No LMP recorded.          Sexually active: {yes no:314532}  The current method of family planning is {contraception:315051}.    Exercising: {yes no:314532}  {types:19826} Smoker:  {YES [id number]  Health Maintenance: Pap:  08/30/18 WNL HPV Neg, 07-02-15 Neg:Neg HR HPV History of abnormal Pap:  yes years ago  MMG:  10/09/19 diag breast ultrasound normal  BMD:   n/a Colonoscopy: n/a TDaP:  08/30/18  Gardasil: yes x2   reports that she has never smoked. She has never used smokeless tobacco. She reports current alcohol use of about 2.0 standard drinks of alcohol per week. She reports that she does not use drugs.  Past Medical History:  Diagnosis Date   Migraine without aura     Past Surgical History:  Procedure Laterality Date   BRAIN SURGERY  1980   removed abcess on brain    CESAREAN SECTION      Current Outpatient Medications  Medication Sig Dispense Refill   Cyanocobalamin (VITAMIN B 12 PO) Take 1 tablet by mouth.     fluticasone (FLONASE) 50 MCG/ACT nasal spray Place 1 spray into both nostrils daily.     No current facility-administered medications for this visit.    Family History  Problem Relation Age of Onset   Hypertension Father    Stroke Maternal Grandmother    Stroke Paternal Grandmother    Breast cancer Maternal Aunt     Review of Systems  Exam:   There were no vitals taken for this visit.  Weight change: @WEIGHTCHANGE @ Height:      Ht Readings from Last 3 Encounters:  09/09/19 5' 0.5" (1.537 m)  08/30/18 5' 0.63" (1.54 m)  09/19/17 5\' 1"  (1.549 m)    General appearance: alert, cooperative and appears stated age Head: Normocephalic, without obvious abnormality, atraumatic Neck: no adenopathy, supple, symmetrical, trachea midline and thyroid {CHL AMB PHY EX THYROID NORM [phone]} Lungs: clear to auscultation bilaterally Cardiovascular: regular rate and  rhythm Breasts: {Exam; breast:13139} Abdomen: soft, non-tender; non distended,  no masses,  no organomegaly Extremities: extremities normal, atraumatic, no cyanosis or edema Skin: Skin color, texture, turgor normal. No rashes or lesions Lymph nodes: Cervical, supraclavicular, and axillary nodes normal. No abnormal inguinal nodes palpated Neurologic: Grossly normal   Pelvic: External genitalia:  no lesions              Urethra:  normal appearing urethra with no masses, tenderness or lesions              Bartholins and Skenes: normal                 Vagina: normal appearing vagina with normal color and discharge, no lesions              Cervix: {CHL AMB PHY EX CERVIX NORM DEFAULT:[phone]}               Bimanual Exam:  Uterus:  {CHL AMB PHY EX UTERUS NORM [phone]}              Adnexa: {CHL AMB PHY EX ADNEXA NO MASS [phone]}               Rectovaginal: Confirms               Anus:  normal sphincter tone, no lesions  *** chaperoned for the exam.  A:  Well Woman with  normal exam  P:

## 2020-09-10 ENCOUNTER — Ambulatory Visit: Payer: BC Managed Care – PPO | Admitting: Obstetrics and Gynecology

## 2020-09-10 DIAGNOSIS — Z0289 Encounter for other administrative examinations: Secondary | ICD-10-CM

## 2020-10-13 DIAGNOSIS — B353 Tinea pedis: Secondary | ICD-10-CM | POA: Diagnosis not present

## 2020-10-13 DIAGNOSIS — M722 Plantar fascial fibromatosis: Secondary | ICD-10-CM | POA: Diagnosis not present

## 2020-11-02 DIAGNOSIS — L259 Unspecified contact dermatitis, unspecified cause: Secondary | ICD-10-CM | POA: Diagnosis not present

## 2020-11-20 DIAGNOSIS — Z23 Encounter for immunization: Secondary | ICD-10-CM | POA: Diagnosis not present
